# Patient Record
Sex: Female | Born: 1960 | Race: White | Hispanic: No | Smoking: Current every day smoker
Health system: Southern US, Community
[De-identification: ages and names within clinical notes are randomized; demographics above are authoritative.]

## PROBLEM LIST (undated history)

## (undated) DIAGNOSIS — K219 Gastro-esophageal reflux disease without esophagitis: Secondary | ICD-10-CM

## (undated) DIAGNOSIS — E039 Hypothyroidism, unspecified: Secondary | ICD-10-CM

## (undated) DIAGNOSIS — I10 Essential (primary) hypertension: Secondary | ICD-10-CM

## (undated) DIAGNOSIS — M1990 Unspecified osteoarthritis, unspecified site: Secondary | ICD-10-CM

## (undated) DIAGNOSIS — F419 Anxiety disorder, unspecified: Secondary | ICD-10-CM

## (undated) HISTORY — DX: Anxiety disorder, unspecified: F41.9

## (undated) HISTORY — PX: OTHER SURGICAL HISTORY: SHX169

---

## 2005-04-05 ENCOUNTER — Other Ambulatory Visit: Admission: RE | Admit: 2005-04-05 | Discharge: 2005-04-05 | Payer: Self-pay | Admitting: Unknown Physician Specialty

## 2005-04-05 ENCOUNTER — Encounter (INDEPENDENT_AMBULATORY_CARE_PROVIDER_SITE_OTHER): Payer: Self-pay | Admitting: *Deleted

## 2005-04-05 ENCOUNTER — Encounter (INDEPENDENT_AMBULATORY_CARE_PROVIDER_SITE_OTHER): Payer: Self-pay | Admitting: Specialist

## 2005-04-06 ENCOUNTER — Other Ambulatory Visit: Admission: RE | Admit: 2005-04-06 | Discharge: 2005-04-06 | Payer: Self-pay | Admitting: Unknown Physician Specialty

## 2007-03-11 ENCOUNTER — Emergency Department (HOSPITAL_COMMUNITY): Admission: EM | Admit: 2007-03-11 | Discharge: 2007-03-11 | Payer: Self-pay | Admitting: Emergency Medicine

## 2011-06-10 ENCOUNTER — Other Ambulatory Visit (HOSPITAL_COMMUNITY): Payer: Self-pay | Admitting: Nurse Practitioner

## 2011-06-10 DIAGNOSIS — Z139 Encounter for screening, unspecified: Secondary | ICD-10-CM

## 2011-06-16 ENCOUNTER — Ambulatory Visit (HOSPITAL_COMMUNITY)
Admission: RE | Admit: 2011-06-16 | Discharge: 2011-06-16 | Disposition: A | Discharge status: Home / Self Care | Payer: Self-pay | Source: Ambulatory Visit | Attending: Nurse Practitioner | Admitting: Nurse Practitioner

## 2011-06-16 DIAGNOSIS — Z139 Encounter for screening, unspecified: Secondary | ICD-10-CM

## 2012-12-21 ENCOUNTER — Other Ambulatory Visit (INDEPENDENT_AMBULATORY_CARE_PROVIDER_SITE_OTHER): Payer: Self-pay

## 2014-02-09 ENCOUNTER — Other Ambulatory Visit: Payer: Self-pay | Admitting: Oncology

## 2014-05-26 IMAGING — MG MM DIGITAL SCREENING
4 series · 4 of 4 positions shown · non-contrast
Comparison: Previous Exams

CLINICAL DATA: Screening.

MAMMOGRAPHIC BILATERAL DIGITAL SCREENING WITH CAD

[L CC]
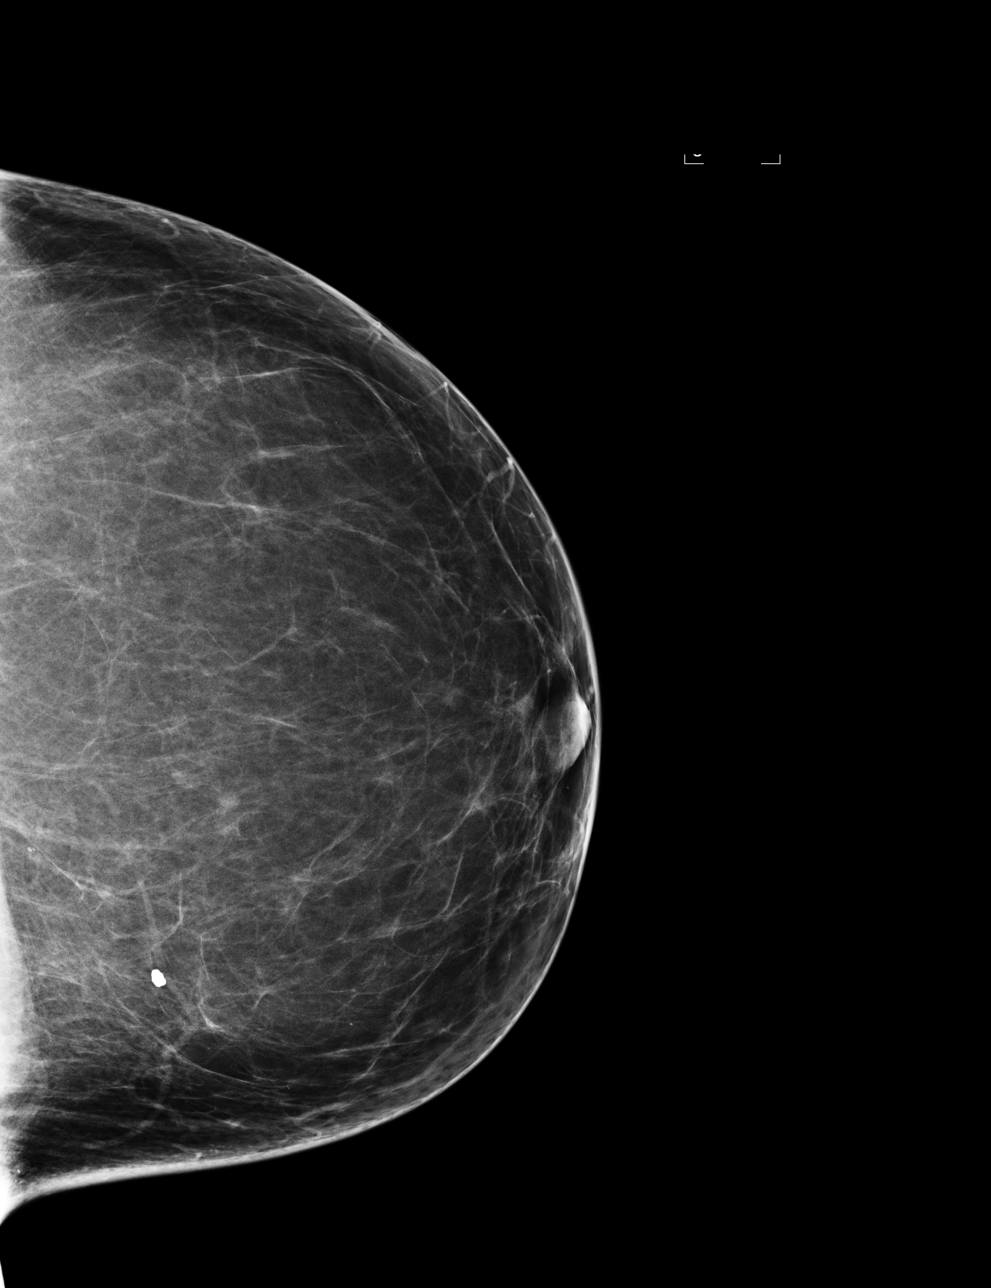

[L MLO]
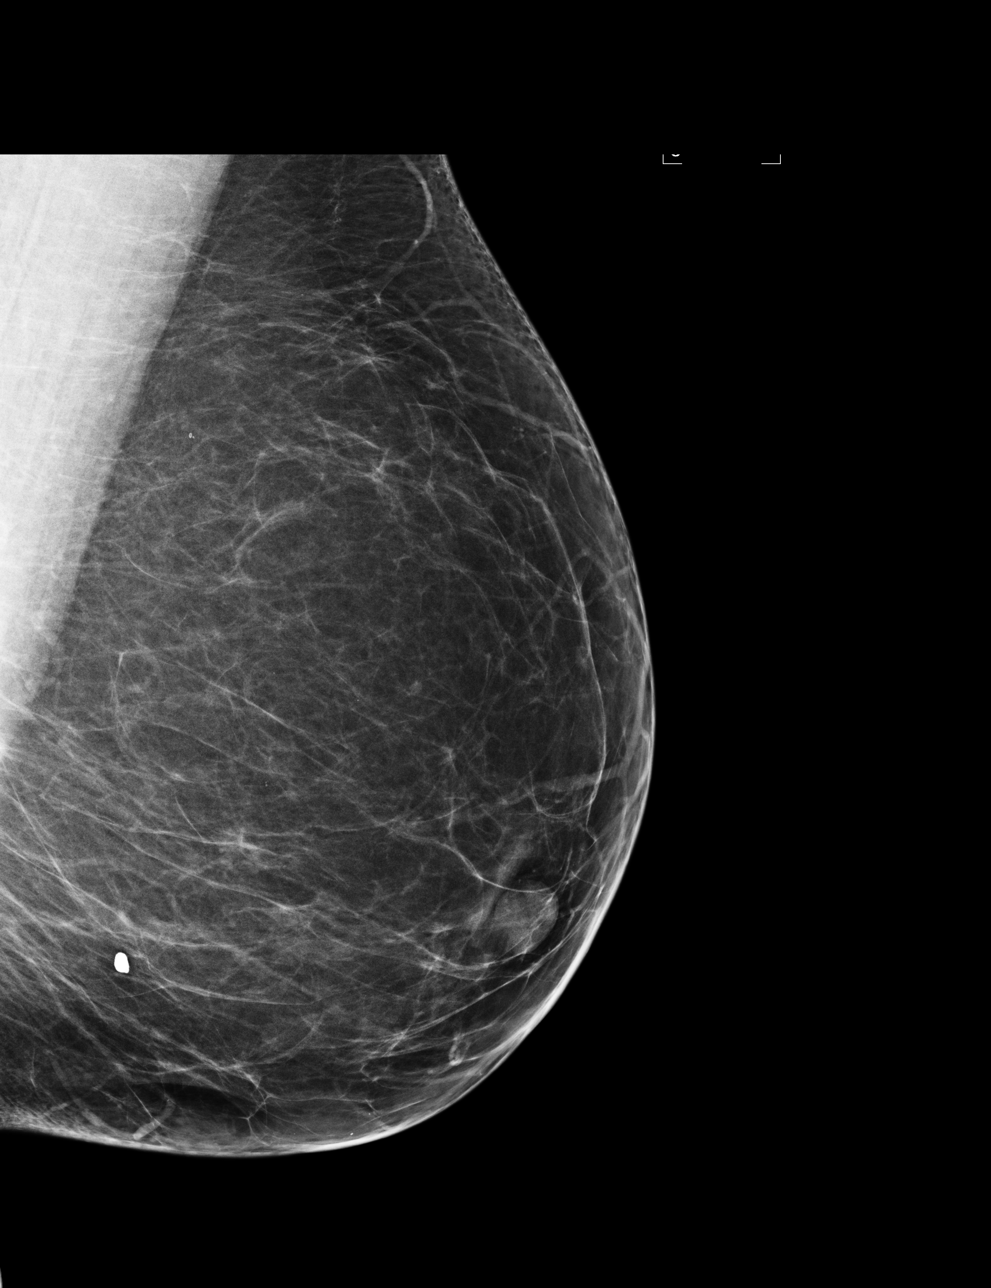

[R CC]
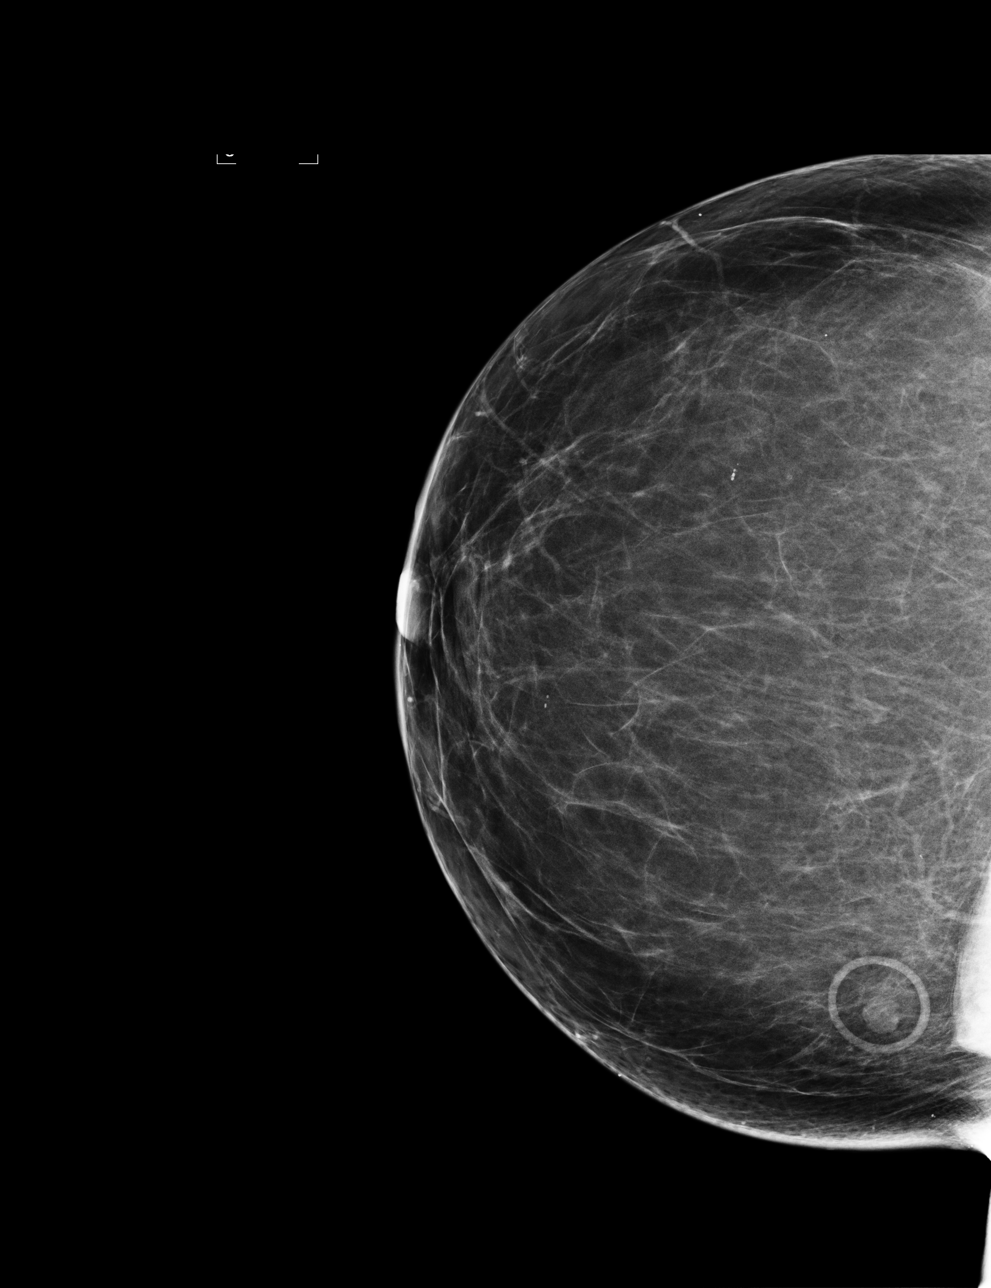

[R MLO]
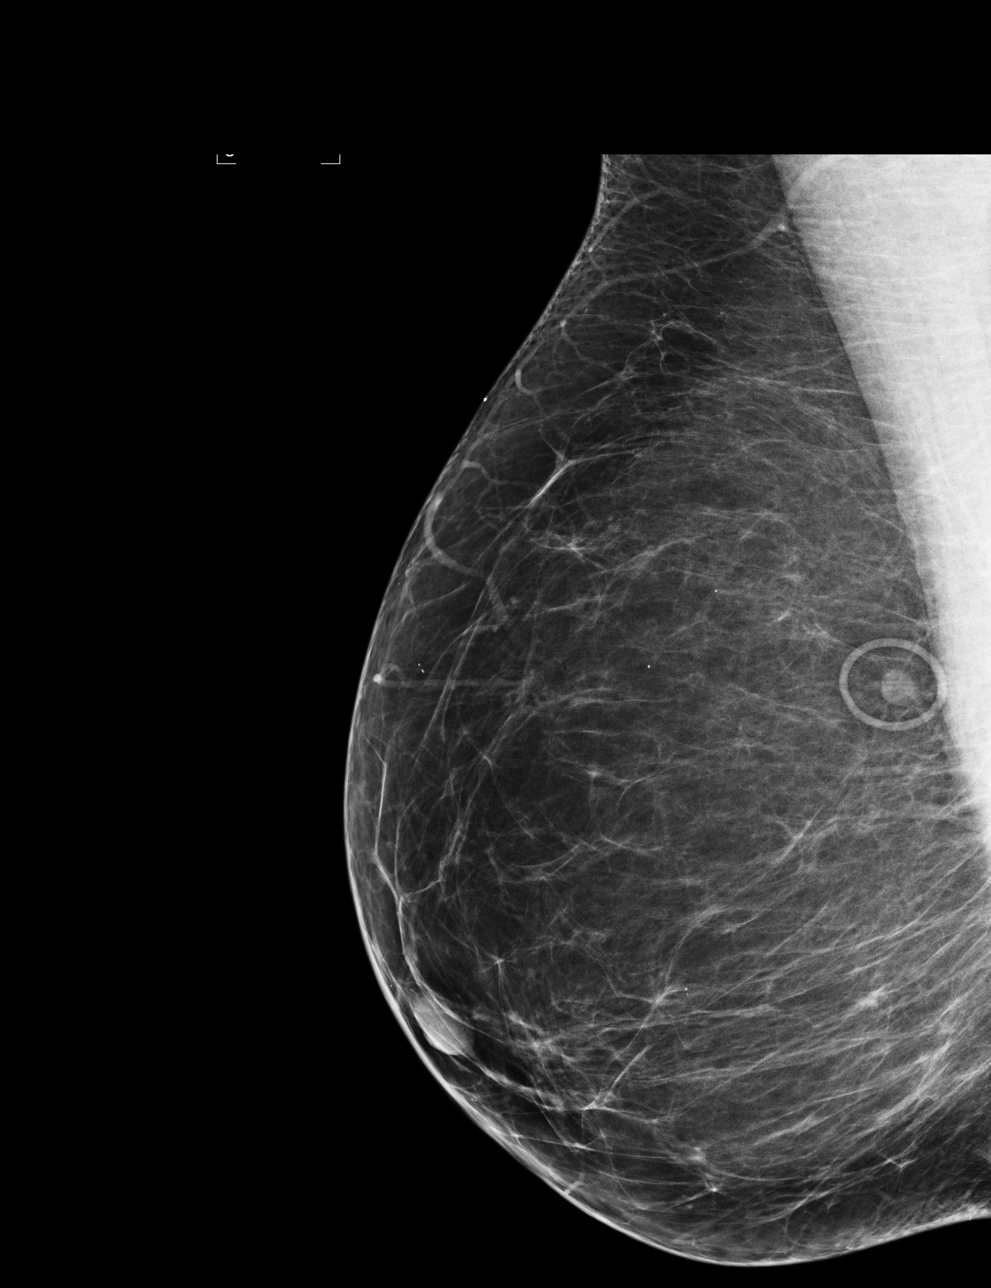

[4 of 4 positions shown; findings below may reference images not displayed]

FINDINGS: The breast tissue is almost entirely fatty. No suspicious
masses, architectural distortion, or calcifications are present.

Images were processed with CAD.
IMPRESSION: No specific mammographic evidence of malignancy.

A result letter of this screening mammogram will be mailed directly
to the patient.

RECOMMENDATION:
 Screening mammogram in one year. (Code:[id number])

BI-RADS CATEGORY 1:  Negative

## 2014-06-18 ENCOUNTER — Other Ambulatory Visit (HOSPITAL_COMMUNITY): Payer: Self-pay | Admitting: Internal Medicine

## 2014-06-18 DIAGNOSIS — Z1231 Encounter for screening mammogram for malignant neoplasm of breast: Secondary | ICD-10-CM

## 2014-06-25 ENCOUNTER — Ambulatory Visit (HOSPITAL_COMMUNITY)
Admission: RE | Admit: 2014-06-25 | Discharge: 2014-06-25 | Disposition: A | Discharge status: Home / Self Care | Payer: BLUE CROSS/BLUE SHIELD | Source: Ambulatory Visit | Attending: Internal Medicine | Admitting: Internal Medicine

## 2014-06-25 DIAGNOSIS — Z1231 Encounter for screening mammogram for malignant neoplasm of breast: Secondary | ICD-10-CM | POA: Diagnosis not present

## 2015-06-11 ENCOUNTER — Other Ambulatory Visit (HOSPITAL_COMMUNITY): Payer: Self-pay | Admitting: Internal Medicine

## 2015-06-11 DIAGNOSIS — Z1231 Encounter for screening mammogram for malignant neoplasm of breast: Secondary | ICD-10-CM

## 2015-07-01 ENCOUNTER — Ambulatory Visit (HOSPITAL_COMMUNITY): Payer: BLUE CROSS/BLUE SHIELD

## 2015-07-02 ENCOUNTER — Ambulatory Visit (HOSPITAL_COMMUNITY)
Admission: RE | Admit: 2015-07-02 | Discharge: 2015-07-02 | Disposition: A | Discharge status: Home / Self Care | Payer: BLUE CROSS/BLUE SHIELD | Source: Ambulatory Visit | Attending: Internal Medicine | Admitting: Internal Medicine

## 2015-07-02 DIAGNOSIS — Z1231 Encounter for screening mammogram for malignant neoplasm of breast: Secondary | ICD-10-CM | POA: Diagnosis present

## 2015-11-04 ENCOUNTER — Other Ambulatory Visit (HOSPITAL_COMMUNITY): Payer: Self-pay | Admitting: Internal Medicine

## 2015-11-04 DIAGNOSIS — Z13820 Encounter for screening for osteoporosis: Secondary | ICD-10-CM

## 2015-11-11 ENCOUNTER — Ambulatory Visit: Payer: BLUE CROSS/BLUE SHIELD | Admitting: Orthopaedic Surgery

## 2015-11-23 ENCOUNTER — Other Ambulatory Visit (HOSPITAL_COMMUNITY): Payer: BLUE CROSS/BLUE SHIELD

## 2015-11-25 ENCOUNTER — Other Ambulatory Visit (HOSPITAL_COMMUNITY): Payer: BLUE CROSS/BLUE SHIELD

## 2015-12-02 ENCOUNTER — Ambulatory Visit (INDEPENDENT_AMBULATORY_CARE_PROVIDER_SITE_OTHER): Payer: BLUE CROSS/BLUE SHIELD

## 2015-12-02 ENCOUNTER — Ambulatory Visit (INDEPENDENT_AMBULATORY_CARE_PROVIDER_SITE_OTHER): Payer: BLUE CROSS/BLUE SHIELD | Admitting: Orthopaedic Surgery

## 2015-12-02 ENCOUNTER — Ambulatory Visit (HOSPITAL_COMMUNITY)
Admission: RE | Admit: 2015-12-02 | Discharge: 2015-12-02 | Disposition: A | Discharge status: Home / Self Care | Payer: BLUE CROSS/BLUE SHIELD | Source: Ambulatory Visit | Attending: Internal Medicine | Admitting: Internal Medicine

## 2015-12-02 ENCOUNTER — Encounter: Payer: Self-pay | Admitting: Orthopaedic Surgery

## 2015-12-02 VITALS — BP 105/65 | HR 113 | Temp 97.3°F | Ht 64.0 in | Wt 172.0 lb

## 2015-12-02 DIAGNOSIS — Z13820 Encounter for screening for osteoporosis: Secondary | ICD-10-CM | POA: Diagnosis present

## 2015-12-02 DIAGNOSIS — M8589 Other specified disorders of bone density and structure, multiple sites: Secondary | ICD-10-CM | POA: Diagnosis not present

## 2015-12-02 DIAGNOSIS — M7062 Trochanteric bursitis, left hip: Secondary | ICD-10-CM | POA: Diagnosis not present

## 2015-12-02 DIAGNOSIS — F17210 Nicotine dependence, cigarettes, uncomplicated: Secondary | ICD-10-CM | POA: Diagnosis not present

## 2015-12-02 DIAGNOSIS — M25552 Pain in left hip: Secondary | ICD-10-CM | POA: Diagnosis not present

## 2015-12-02 MED ORDER — NAPROXEN 500 MG PO TABS: 500.0000 mg | ORAL_TABLET | Freq: Two times a day (BID) | ORAL | 5 refills | Status: DC

## 2015-12-02 NOTE — Patient Instructions (Signed)
Steps to Quit Smoking Smoking tobacco can be bad for your health. It can also affect almost every organ in your body. Smoking puts you and people around you at risk for many serious long-lasting (chronic) diseases. Quitting smoking is hard, but it is one of the best things that you can do for your health. It is never too late to quit. What are the benefits of quitting smoking? When you quit smoking, you lower your risk for getting serious diseases and conditions. They can include:  Lung cancer or lung disease.  Heart disease.  Stroke.  Heart attack.  Not being able to have children (infertility).  Weak bones (osteoporosis) and broken bones (fractures). If you have coughing, wheezing, and shortness of breath, those symptoms may get better when you quit. You may also get sick less often. If you are pregnant, quitting smoking can help to lower your chances of having a baby of low birth weight. What can I do to help me quit smoking? Talk with your doctor about what can help you quit smoking. Some things you can do (strategies) include:  Quitting smoking totally, instead of slowly cutting back how much you smoke over a period of time.  Going to in-person counseling. You are more likely to quit if you go to many counseling sessions.  Using resources and support systems, such as:  Online chats with a counselor.  Phone quitlines.  Printed self-help materials.  Support groups or group counseling.  Text messaging programs.  Mobile phone apps or applications.  Taking medicines. Some of these medicines may have nicotine in them. If you are pregnant or breastfeeding, do not take any medicines to quit smoking unless your doctor says it is okay. Talk with your doctor about counseling or other things that can help you. Talk with your doctor about using more than one strategy at the same time, such as taking medicines while you are also going to in-person counseling. This can help make quitting  easier. What things can I do to make it easier to quit? Quitting smoking might feel very hard at first, but there is a lot that you can do to make it easier. Take these steps:  Talk to your family and friends. Ask them to support and encourage you.  Call phone quitlines, reach out to support groups, or work with a counselor.  Ask people who smoke to not smoke around you.  Avoid places that make you want (trigger) to smoke, such as:  Bars.  Parties.  Smoke-break areas at work.  Spend time with people who do not smoke.  Lower the stress in your life. Stress can make you want to smoke. Try these things to help your stress:  Getting regular exercise.  Deep-breathing exercises.  Yoga.  Meditating.  Doing a body scan. To do this, close your eyes, focus on one area of your body at a time from head to toe, and notice which parts of your body are tense. Try to relax the muscles in those areas.  Download or buy apps on your mobile phone or tablet that can help you stick to your quit plan. There are many free apps, such as QuitGuide from the CDC (Centers for Disease Control and Prevention). You can find more support from smokefree.gov and other websites. This information is not intended to replace advice given to you by your health care provider. Make sure you discuss any questions you have with your health care provider. Document Released: 10/16/2008 Document Revised: 08/18/2015 Document   Reviewed: 05/06/2014 Elsevier Interactive Patient Education  2017 Elsevier Inc.  

## 2015-12-02 NOTE — Progress Notes (Signed)
Subjective:    Patient ID: Melissa Vega, female    DOB: 02/09/1960, 55 y.o.   MRN: [id number]  HPI She has had left hip pain for several months.  She has no trauma. She has lateral pain of the hip.  She has been limping. She has no redness. She saw Dr. Hughie ClossZ. Hall and he asked she come here.  She has not taken any medicine, not used ice or heat.  She has no other joint pain.   Review of Systems  HENT: Negative for congestion.   Respiratory: Negative for cough and shortness of breath.   Cardiovascular: Negative for chest pain and leg swelling.  Endocrine: Negative for cold intolerance.  Musculoskeletal: Positive for arthralgias and gait problem.  Allergic/Immunologic: Negative for environmental allergies.   History reviewed. No pertinent past medical history.  History reviewed. No pertinent surgical history.  No current outpatient prescriptions on file prior to visit.   No current facility-administered medications on file prior to visit.     Social History   Social History  . Marital status: Widowed    Spouse name: N/A  . Number of children: N/A  . Years of education: N/A   Occupational History  . Not on file.   Social History Main Topics  . Smoking status: Current Every Day Smoker  . Smokeless tobacco: Never Used  . Alcohol use Not on file  . Drug use: Unknown  . Sexual activity: Not on file   Other Topics Concern  . Not on file   Social History Narrative  . No narrative on file    Family History  Problem Relation Age of Onset  . Cancer Mother   . Cancer Father   . Cancer Brother     BP 105/65   Pulse (!) 113   Temp 97.3 F (36.3 C)   Ht 5\' 4"  (1.626 m)   Wt 172 lb (78 kg)   BMI 29.52 kg/m      Objective:   Physical Exam  Constitutional: She is oriented to person, place, and time. She appears well-developed and well-nourished.  HENT:  Head: Normocephalic and atraumatic.  Eyes: Conjunctivae and EOM are normal. Pupils are equal, round, and reactive  to light.  Neck: Normal range of motion. Neck supple.  Cardiovascular: Normal rate, regular rhythm and intact distal pulses.   Pulmonary/Chest: Effort normal.  Abdominal: Soft.  Musculoskeletal: She exhibits tenderness (Pain left hip over trochanteric area, no swelling, no redness, ROM full.  Right hip negative.  Limp to the left.).  Neurological: She is alert and oriented to person, place, and time. She displays normal reflexes. No cranial nerve deficit. She exhibits normal muscle tone. Coordination normal.  Skin: Skin is warm and dry.  Psychiatric: She has a normal mood and affect. Her behavior is normal. Judgment and thought content normal.    X-rays of the left hip were done, reported separately.  She smokes and is not willing to stop or cut back.    Assessment & Plan:   Encounter Diagnoses  Name Primary?  . Hip pain, acute, left Yes  . Trochanteric bursitis, left hip   . Cigarette nicotine dependence without complication    I have explained her diagnosis to her.  PROCEDURE NOTE:  The patient request injection, verbal consent was obtained.  The left trochanteric area of the hip was prepped appropriately after time out was performed.   Sterile technique was observed and injection of 1 cc of Depo-Medrol 40 mg with several  cc's of plain xylocaine. Anesthesia was provided by ethyl chloride and a 20-gauge needle was used to inject the hip area. The injection was tolerated well.  A band aid dressing was applied.  The patient was advised to apply ice later today and tomorrow to the injection sight as needed.  Return in two weeks.  Begin Naprosyn 500 po bid pc  Precautions discussed.  Call if any problem.  Electronically Signed Darreld McleanWayne Shalayna Ornstein, MD 11/29/20172:11 PM

## 2015-12-16 ENCOUNTER — Ambulatory Visit: Payer: BLUE CROSS/BLUE SHIELD | Admitting: Orthopaedic Surgery

## 2016-10-17 ENCOUNTER — Encounter: Payer: Self-pay | Admitting: Gastroenterology

## 2016-11-30 ENCOUNTER — Ambulatory Visit: Payer: BLUE CROSS/BLUE SHIELD | Admitting: Gastroenterology

## 2017-01-18 ENCOUNTER — Ambulatory Visit: Payer: BLUE CROSS/BLUE SHIELD | Admitting: Gastroenterology

## 2017-01-18 ENCOUNTER — Telehealth: Payer: Self-pay | Admitting: *Deleted

## 2017-01-18 ENCOUNTER — Encounter: Payer: Self-pay | Admitting: Gastroenterology

## 2017-01-18 ENCOUNTER — Encounter: Payer: Self-pay | Admitting: *Deleted

## 2017-01-18 ENCOUNTER — Telehealth: Payer: Self-pay

## 2017-01-18 ENCOUNTER — Other Ambulatory Visit: Payer: Self-pay | Admitting: *Deleted

## 2017-01-18 DIAGNOSIS — Z1211 Encounter for screening for malignant neoplasm of colon: Secondary | ICD-10-CM | POA: Diagnosis not present

## 2017-01-18 DIAGNOSIS — K219 Gastro-esophageal reflux disease without esophagitis: Secondary | ICD-10-CM | POA: Diagnosis not present

## 2017-01-18 NOTE — Telephone Encounter (Signed)
Called and informed pt of pre-op appt 02/01/17 at 1:45pm. Letter mailed.

## 2017-01-18 NOTE — Assessment & Plan Note (Signed)
SYMPTOMS FAIRLY WELL CONTROLLED.  CONTINUE OMEPRAZOLE.  TAKE 30 MINUTES PRIOR TO YOUR FIRST MEAL.  

## 2017-01-18 NOTE — H&P (View-Only) (Signed)
 Subjective:    Patient ID: Melissa Vega, female    DOB: 11/17/1960, 56 y.o.   MRN: [id number]  Hall, John Z, MD  HPI Once in awhile she sees blood. NO BLOOD IN PAST 2-3 MOS. WAS ON DICLOFENAC MAY 2018 WHEN [facility] THOUGHT SHE WAS HAVING A HEART ATTACK. NEVER HAD SURGERY. EPISODE OF CHEST PAIN AT WORK. WENT TO ED. NO CARDIOLOGY EVALUATION. SMOKES 1/2-1 WHOLE PK CIGS A DAY. BMs:2-3 TIMES (LOOSE FOR YEARS-DIARRHEA). NO RECTAL PRESSURE, PAIN, ITCHING, BURNING, OR SOILING. MILK: NOT A WHOLE LOT, CHEESE: NOT MUCH, ICE CREAM: NOT MUCH, FAST FOOD: 2X/WEEK. SYNTHROID DOSE CHANGED IN OCT 2018. HAVING A SUPER BOWL PARTY.    PT DENIES FEVER, CHILLS, HEMATEMESIS, nausea, vomiting, melena, diarrhea, SHORTNESS OF BREATH, CHANGE IN BOWEL IN HABITS, constipation, abdominal pain, problems swallowing, OR heartburn or indigestion.  Past Medical History:  Diagnosis Date  . Anxiety    Past Surgical History:  Procedure Laterality Date  . NONE     Allergies  Allergen Reactions  . Penicillins    Current Outpatient Medications  Medication Sig Dispense Refill  . B Complex-C-Folic Acid (STRESS B COMPLEX PO) Take by mouth daily.    . diazepam (VALIUM) 5 MG tablet 2 (two) times daily as needed.    . levothyroxine (SYNTHROID, LEVOTHROID) 100 MCG tablet daily.    . lisinopril-hydrochlorothiazide (PRINZIDE,ZESTORETIC) 20-12.5 MG tablet daily.    . LYSINE PO Take by mouth daily.    . Misc Natural Products (OSTEO BI-FLEX JOINT SHIELD PO) Take by mouth daily.    . Multiple Vitamins-Minerals (CENTRUM SILVER PO) Take by mouth daily.    . naproxen (NAPROSYN) 500 MG tablet Take 500 mg by mouth daily.    . Omega-3 Fatty Acids (FISH OIL) 1000 MG CAPS Take by mouth daily.    . omeprazole (PRILOSEC) 40 MG capsule daily.    . Potassium 99 MG TABS Take by mouth daily.    . pregabalin (LYRICA) 75 MG capsule Take 75 mg by mouth daily.    BC POWDER EVERY DAY   Social History   Socioeconomic History  . Marital status:  Widowed    Spouse name: None  . Number of children: None  . Years of education: None  . Highest education level: None  Social Needs  . Financial resource strain: None  . Food insecurity - worry: None  . Food insecurity - inability: None  . Transportation needs - medical: None  . Transportation needs - non-medical: None  Occupational History  . None  Tobacco Use  . Smoking status: Current Every Day Smoker  . Smokeless tobacco: Never Used  Substance and Sexual Activity  . Alcohol use: None  . Drug use: None  . Sexual activity: None  Other Topics Concern  . None  Social History Narrative   WIDOWED FOR '96. HUSBAND DIED WITH LUNG CANCER. BOYFRIEND DIED OF RECTAL CANCER.    KIDS: 1 KID-FEMALE AGE 37. 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   Review of Systems PER HPI OTHERWISE ALL SYSTEMS ARE NEGATIVE.    Objective:   Physical Exam  Constitutional: She is oriented to person, place, and time. She appears well-developed and well-nourished. No distress.  HENT:  Head: Normocephalic and atraumatic.  Mouth/Throat: Oropharynx is clear and moist. No oropharyngeal exudate.  Eyes: Pupils are equal, round, and reactive to light. No scleral icterus.  Neck: Normal range of motion. Neck supple.  Cardiovascular: Normal rate, regular rhythm and   normal heart sounds.  Pulmonary/Chest: Effort normal and breath sounds normal. No respiratory distress.  Abdominal: Soft. Bowel sounds are normal. She exhibits no distension. There is no tenderness.  Musculoskeletal: She exhibits no edema.  Lymphadenopathy:    She has no cervical adenopathy.  Neurological: She is alert and oriented to person, place, and time.  NO FOCAL DEFICITS  Psychiatric: She has a normal mood and affect.  Vitals reviewed.     Assessment & Plan:

## 2017-01-18 NOTE — Assessment & Plan Note (Addendum)
AVERAGE RISK. CURRENTLY NO WARNING SIGNS/SYMPTOMS  DRINK WATER TO KEEP YOUR URINE LIGHT YELLOW. FOLLOW A HIGH FIBER DIET.  HANDOUT GIVEN. Complete colonoscopy FEB 5. DISCUSSED PROCEDURE, COLOWRAP, BENEFITS, & RISKS: < 1% chance of medication reaction, bleeding, perforation, or rupture of spleen/liver. USE PREPARATION H FOUR TIMES  A DAY IF NEEDED TO RELIEVE RECTAL PAIN/PRESSURE/BLEEDING.  FOLLOW UP IF NEEDED.

## 2017-01-18 NOTE — Patient Instructions (Signed)
DRINK WATER TO KEEP YOUR URINE LIGHT YELLOW.  FOLLOW A HIGH FIBER DIET. SEE INFO BELOW.  Complete colonoscopy FEB 5.  USE PREPARATION H FOUR TIMES  A DAY IF NEEDED TO RELIEVE RECTAL PAIN/PRESSURE/BLEEDING.  FOLLOW UP IF NEEDED.  High-Fiber Diet A high-fiber diet changes your normal diet to include more whole grains, legumes, fruits, and vegetables. Changes in the diet involve replacing refined carbohydrates with unrefined foods. The calorie level of the diet is essentially unchanged. The Dietary Reference Intake (recommended amount) for adult males is 38 grams per day. For adult females, it is 25 grams per day. Pregnant and lactating women should consume 28 grams of fiber per day.Fiber is the intact part of a plant that is not broken down during digestion. Functional fiber is fiber that has been isolated from the plant to provide a beneficial effect in the body.  PURPOSE  Increase stool bulk.   Ease and regulate bowel movements.   Lower cholesterol.   REDUCE RISK OF COLON CANCER  INDICATIONS THAT YOU NEED MORE FIBER  Constipation and hemorrhoids.   Uncomplicated diverticulosis (intestine condition) and irritable bowel syndrome.   Weight management.   As a protective measure against hardening of the arteries (atherosclerosis), diabetes, and cancer.   GUIDELINES FOR INCREASING FIBER IN THE DIET  Start adding fiber to the diet slowly. A gradual increase of about 5 more grams (2 slices of whole-wheat bread, 2 servings of most fruits or vegetables, or 1 bowl of high-fiber cereal) per day is best. Too rapid an increase in fiber may result in constipation, flatulence, and bloating.   Drink enough water and fluids to keep your urine clear or pale yellow. Water, juice, or caffeine-free drinks are recommended. Not drinking enough fluid may cause constipation.   Eat a variety of high-fiber foods rather than one type of fiber.   Try to increase your intake of fiber through using  high-fiber foods rather than fiber pills or supplements that contain small amounts of fiber.   The goal is to change the types of food eaten. Do not supplement your present diet with high-fiber foods, but replace foods in your present diet.  INCLUDE A VARIETY OF FIBER SOURCES  Replace refined and processed grains with whole grains, canned fruits with fresh fruits, and incorporate other fiber sources. White rice, white breads, and most bakery goods contain little or no fiber.   Brown whole-grain rice, buckwheat oats, and many fruits and vegetables are all good sources of fiber. These include: broccoli, Brussels sprouts, cabbage, cauliflower, beets, sweet potatoes, white potatoes (skin on), carrots, tomatoes, eggplant, squash, berries, fresh fruits, and dried fruits.   Cereals appear to be the richest source of fiber. Cereal fiber is found in whole grains and bran. Bran is the fiber-rich outer coat of cereal grain, which is largely removed in refining. In whole-grain cereals, the bran remains. In breakfast cereals, the largest amount of fiber is found in those with "bran" in their names. The fiber content is sometimes indicated on the label.   You may need to include additional fruits and vegetables each day.   In baking, for 1 cup white flour, you may use the following substitutions:   1 cup whole-wheat flour minus 2 tablespoons.   1/2 cup white flour plus 1/2 cup whole-wheat flour.

## 2017-01-18 NOTE — Telephone Encounter (Signed)
CALLED PT INSURANCE WITH [employer] AND WAS ADVISED NO PRECERT REQUIRED FOR TCS

## 2017-01-18 NOTE — Progress Notes (Addendum)
Subjective:    Patient ID: Melissa Vega, female    DOB: 01/11/1960, 57 y.o.   MRN: [id number]  Benita StabileHall, John Z, MD  HPI Once in awhile she sees blood. NO BLOOD IN PAST 2-3 MOS. WAS ON DICLOFENAC MAY 2018 WHEN DOCS THOUGHT SHE WAS HAVING A HEART ATTACK. NEVER HAD SURGERY. EPISODE OF CHEST PAIN AT WORK. WENT TO ED. NO CARDIOLOGY EVALUATION. SMOKES 1/2-1 WHOLE PK CIGS A DAY. BMs:2-3 TIMES (LOOSE FOR YEARS-DIARRHEA). NO RECTAL PRESSURE, PAIN, ITCHING, BURNING, OR SOILING. MILK: NOT A WHOLE LOT, CHEESE: NOT MUCH, ICE CREAM: NOT MUCH, FAST FOOD: 2X/WEEK. SYNTHROID DOSE CHANGED IN OCT 2018. HAVING A SUPER BOWL PARTY.    PT DENIES FEVER, CHILLS, HEMATEMESIS, nausea, vomiting, melena, diarrhea, SHORTNESS OF BREATH, CHANGE IN BOWEL IN HABITS, constipation, abdominal pain, problems swallowing, OR heartburn or indigestion.  Past Medical History:  Diagnosis Date  . Anxiety    Past Surgical History:  Procedure Laterality Date  . NONE     Allergies  Allergen Reactions  . Penicillins    Current Outpatient Medications  Medication Sig Dispense Refill  . B Complex-C-Folic Acid (STRESS B COMPLEX PO) Take by mouth daily.    . diazepam (VALIUM) 5 MG tablet 2 (two) times daily as needed.    Marland Kitchen. levothyroxine (SYNTHROID, LEVOTHROID) 100 MCG tablet daily.    Marland Kitchen. lisinopril-hydrochlorothiazide (PRINZIDE,ZESTORETIC) 20-12.5 MG tablet daily.    Marland Kitchen. LYSINE PO Take by mouth daily.    . Misc Natural Products (OSTEO BI-FLEX JOINT SHIELD PO) Take by mouth daily.    . Multiple Vitamins-Minerals (CENTRUM SILVER PO) Take by mouth daily.    . naproxen (NAPROSYN) 500 MG tablet Take 500 mg by mouth daily.    . Omega-3 Fatty Acids (FISH OIL) 1000 MG CAPS Take by mouth daily.    Marland Kitchen. omeprazole (PRILOSEC) 40 MG capsule daily.    . Potassium 99 MG TABS Take by mouth daily.    . pregabalin (LYRICA) 75 MG capsule Take 75 mg by mouth daily.    BC POWDER EVERY DAY   Social History   Socioeconomic History  . Marital status:  Widowed    Spouse name: None  . Number of children: None  . Years of education: None  . Highest education level: None  Social Needs  . Financial resource strain: None  . Food insecurity - worry: None  . Food insecurity - inability: None  . Transportation needs - medical: None  . Transportation needs - non-medical: None  Occupational History  . None  Tobacco Use  . Smoking status: Current Every Day Smoker  . Smokeless tobacco: Never Used  Substance and Sexual Activity  . Alcohol use: None  . Drug use: None  . Sexual activity: None  Other Topics Concern  . None  Social History Narrative   WIDOWED FOR '96. HUSBAND DIED WITH LUNG CANCER. BOYFRIEND DIED OF RECTAL CANCER.    KIDS: 1 KID-FEMALE AGE 30. 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   Review of Systems PER HPI OTHERWISE ALL SYSTEMS ARE NEGATIVE.    Objective:   Physical Exam  Constitutional: She is oriented to person, place, and time. She appears well-developed and well-nourished. No distress.  HENT:  Head: Normocephalic and atraumatic.  Mouth/Throat: Oropharynx is clear and moist. No oropharyngeal exudate.  Eyes: Pupils are equal, round, and reactive to light. No scleral icterus.  Neck: Normal range of motion. Neck supple.  Cardiovascular: Normal rate, regular rhythm and  normal heart sounds.  Pulmonary/Chest: Effort normal and breath sounds normal. No respiratory distress.  Abdominal: Soft. Bowel sounds are normal. She exhibits no distension. There is no tenderness.  Musculoskeletal: She exhibits no edema.  Lymphadenopathy:    She has no cervical adenopathy.  Neurological: She is alert and oriented to person, place, and time.  NO FOCAL DEFICITS  Psychiatric: She has a normal mood and affect.  Vitals reviewed.     Assessment & Plan:

## 2017-01-19 NOTE — Progress Notes (Signed)
CC'D TO PCP °

## 2017-01-30 NOTE — Patient Instructions (Signed)
Melissa Vega  01/30/2017     @PREFPERIOPPHARMACY @   Your procedure is scheduled on  2/50/2019   Report to [facility] at  1215   P.M.  Call this number if you have problems the morning of surgery:  [phone]   Remember:  Do not eat food or drink liquids after midnight.  Take these medicines the morning of surgery with A SIP OF WATER  Valium, levothyroxine, prilosec.   Do not wear jewelry, make-up or nail polish.  Do not wear lotions, powders, or perfumes, or deodorant.  Do not shave 48 hours prior to surgery.  Men may shave face and neck.  Do not bring valuables to the hospital.  [facility] not [facility] any belongings or valuables.  Contacts, dentures or bridgework may not be worn into surgery.  Leave your suitcase in the car.  After surgery it may be brought to your room.  For patients admitted to the hospital, discharge time will be determined by your treatment team.  Patients discharged the day of surgery will not be allowed to drive home.   Name and phone number of your driver:   family Special instructions:  Follow the diet and prep instructions given to you by Dr Evelina DunField's office.  Please read over the following fact sheets that you were given. Anesthesia Post-op Instructions and Care and Recovery After Surgery       Colonoscopy, Adult A colonoscopy is an exam to look at the large intestine. It is done to check for problems, such as:  Lumps (tumors).  Growths (polyps).  Swelling (inflammation).  Bleeding.  What happens before the procedure? Eating and drinking Follow instructions from your doctor about eating and drinking. These instructions may include:  A few days before the procedure - follow a low-fiber diet. ? Avoid nuts. ? Avoid seeds. ? Avoid dried fruit. ? Avoid raw fruits. ? Avoid vegetables.  1-3 days before the procedure - follow a clear liquid diet. Avoid liquids that have red or purple dye. Drink only clear  liquids, such as: ? Clear broth or bouillon. ? Black coffee or tea. ? Clear juice. ? Clear soft drinks or sports drinks. ? Gelatin dessert. ? Popsicles.  On the day of the procedure - do not eat or drink anything during the 2 hours before the procedure.  Bowel prep If you were prescribed an oral bowel prep:  Take it as told by your doctor. Starting the day before your procedure, you will need to drink a lot of liquid. The liquid will cause you to poop (have bowel movements) until your poop is almost clear or light green.  If your skin or butt gets irritated from diarrhea, you may: ? Wipe the area with wipes that have medicine in them, such as adult wet wipes with aloe and vitamin E. ? Put something on your skin that soothes the area, such as petroleum jelly.  If you throw up (vomit) while drinking the bowel prep, take a break for up to 60 minutes. Then begin the bowel prep again. If you keep throwing up and you cannot take the bowel prep without throwing up, call your doctor.  General instructions  Ask your doctor about changing or stopping your normal medicines. This is important if you take diabetes medicines or blood thinners.  Plan to have someone take you home from the hospital or clinic. What happens during the procedure?  An IV tube  may be put into one of your veins.  You will be given medicine to help you relax (sedative).  To reduce your risk of infection: ? Your doctors will wash their hands. ? Your anal area will be washed with soap.  You will be asked to lie on your side with your knees bent.  Your doctor will get a long, thin, flexible tube ready. The tube will have a camera and a light on the end.  The tube will be put into your anus.  The tube will be gently put into your large intestine.  Air will be delivered into your large intestine to keep it open. You may feel some pressure or cramping.  The camera will be used to take photos.  A small tissue  sample may be removed from your body to be looked at under a microscope (biopsy). If any possible problems are found, the tissue will be sent to a lab for testing.  If small growths are found, your doctor may remove them and have them checked for cancer.  The tube that was put into your anus will be slowly removed. The procedure may vary among doctors and hospitals. What happens after the procedure?  Your doctor will check on you often until the medicines you were given have worn off.  Do not drive for 24 hours after the procedure.  You may have a small amount of blood in your poop.  You may pass gas.  You may have mild cramps or bloating in your belly (abdomen).  It is up to you to get the results of your procedure. Ask your doctor, or the department performing the procedure, when your results will be ready. This information is not intended to replace advice given to you by your health care provider. Make sure you discuss any questions you have with your health care provider. Document Released: 01/22/2010 Document Revised: 10/21/2015 Document Reviewed: 03/03/2015 Elsevier Interactive Patient Education  2017 [employer]  Colonoscopy, Adult, Care After This sheet gives you information about how to care for yourself after your procedure. Your health care provider may also give you more specific instructions. If you have problems or questions, contact your health care provider. What can I expect after the procedure? After the procedure, it is common to have:  A small amount of blood in your stool for 24 hours after the procedure.  Some gas.  Mild abdominal cramping or bloating.  Follow these instructions at home: General instructions   For the first 24 hours after the procedure: ? Do not drive or use machinery. ? Do not sign important documents. ? Do not drink alcohol. ? Do your regular daily activities at a slower pace than normal. ? Eat soft, easy-to-digest foods. ? Rest  often.  Take over-the-counter or prescription medicines only as told by your health care provider.  It is up to you to get the results of your procedure. Ask your health care provider, or the department performing the procedure, when your results will be ready. Relieving cramping and bloating  Try walking around when you have cramps or feel bloated.  Apply heat to your abdomen as told by your health care provider. Use a heat source that your health care provider recommends, such as a moist heat pack or a heating pad. ? Place a towel between your skin and the heat source. ? Leave the heat on for 20-30 minutes. ? Remove the heat if your skin turns bright red. This is especially important if  you are unable to feel pain, heat, or cold. You may have a greater risk of getting burned. Eating and drinking  Drink enough fluid to keep your urine clear or pale yellow.  Resume your normal diet as instructed by your health care provider. Avoid heavy or fried foods that are hard to digest.  Avoid drinking alcohol for as long as instructed by your health care provider. Contact a health care provider if:  You have blood in your stool 2-3 days after the procedure. Get help right away if:  You have more than a small spotting of blood in your stool.  You pass large blood clots in your stool.  Your abdomen is swollen.  You have nausea or vomiting.  You have a fever.  You have increasing abdominal pain that is not relieved with medicine. This information is not intended to replace advice given to you by your health care provider. Make sure you discuss any questions you have with your health care provider. Document Released: 08/04/2003 Document Revised: 09/14/2015 Document Reviewed: 03/03/2015 Elsevier Interactive Patient Education  2018 [place] Anesthesia is a term that refers to techniques, procedures, and medicines that help a person stay safe and comfortable  during a medical procedure. Monitored anesthesia care, or sedation, is one type of anesthesia. Your anesthesia specialist may recommend sedation if you will be having a procedure that does not require you to be unconscious, such as:  Cataract surgery.  A dental procedure.  A biopsy.  A colonoscopy.  During the procedure, you may receive a medicine to help you relax (sedative). There are three levels of sedation:  Mild sedation. At this level, you may feel awake and relaxed. You will be able to follow directions.  Moderate sedation. At this level, you will be sleepy. You may not remember the procedure.  Deep sedation. At this level, you will be asleep. You will not remember the procedure.  The more medicine you are given, the deeper your level of sedation will be. Depending on how you respond to the procedure, the anesthesia specialist may change your level of sedation or the type of anesthesia to fit your needs. An anesthesia specialist will monitor you closely during the procedure. Let your health care provider know about:  Any allergies you have.  All medicines you are taking, including vitamins, herbs, eye drops, creams, and over-the-counter medicines.  Any use of steroids (by mouth or as a cream).  Any problems you or family members have had with sedatives and anesthetic medicines.  Any blood disorders you have.  Any surgeries you have had.  Any medical conditions you have, such as sleep apnea.  Whether you are pregnant or may be pregnant.  Any use of cigarettes, alcohol, or street drugs. What are the risks? Generally, this is a safe procedure. However, problems may occur, including:  Getting too much medicine (oversedation).  Nausea.  Allergic reaction to medicines.  Trouble breathing. If this happens, a breathing tube may be used to help with breathing. It will be removed when you are awake and breathing on your own.  Heart trouble.  Lung trouble.  Before  the procedure Staying hydrated Follow instructions from your health care provider about hydration, which may include:  Up to 2 hours before the procedure - you may continue to drink clear liquids, such as water, clear fruit juice, black coffee, and plain tea.  Eating and drinking restrictions Follow instructions from your health care provider about eating and  drinking, which may include:  8 hours before the procedure - stop eating heavy meals or foods such as meat, fried foods, or fatty foods.  6 hours before the procedure - stop eating light meals or foods, such as toast or cereal.  6 hours before the procedure - stop drinking milk or drinks that contain milk.  2 hours before the procedure - stop drinking clear liquids.  Medicines Ask your health care provider about:  Changing or stopping your regular medicines. This is especially important if you are taking diabetes medicines or blood thinners.  Taking medicines such as aspirin and ibuprofen. These medicines can thin your blood. Do not take these medicines before your procedure if your health care provider instructs you not to.  Tests and exams  You will have a physical exam.  You may have blood tests done to show: ? How well your kidneys and liver are working. ? How well your blood can clot.  General instructions  Plan to have someone take you home from the hospital or clinic.  If you will be going home right after the procedure, plan to have someone with you for 24 hours.  What happens during the procedure?  Your blood pressure, heart rate, breathing, level of pain and overall condition will be monitored.  An IV tube will be inserted into one of your veins.  Your anesthesia specialist will give you medicines as needed to keep you comfortable during the procedure. This may mean changing the level of sedation.  The procedure will be performed. After the procedure  Your blood pressure, heart rate, breathing rate, and  blood oxygen level will be monitored until the medicines you were given have worn off.  Do not drive for 24 hours if you received a sedative.  You may: ? Feel sleepy, clumsy, or nauseous. ? Feel forgetful about what happened after the procedure. ? Have a sore throat if you had a breathing tube during the procedure. ? Vomit. This information is not intended to replace advice given to you by your health care provider. Make sure you discuss any questions you have with your health care provider. Document Released: 09/15/2004 Document Revised: 05/29/2015 Document Reviewed: 04/12/2015 Elsevier Interactive Patient Education  2018 [place], Care After These instructions provide you with information about caring for yourself after your procedure. Your health care provider may also give you more specific instructions. Your treatment has been planned according to current medical practices, but problems sometimes occur. Call your health care provider if you have any problems or questions after your procedure. What can I expect after the procedure? After your procedure, it is common to:  Feel sleepy for several hours.  Feel clumsy and have poor balance for several hours.  Feel forgetful about what happened after the procedure.  Have poor judgment for several hours.  Feel nauseous or vomit.  Have a sore throat if you had a breathing tube during the procedure.  Follow these instructions at home: For at least 24 hours after the procedure:   Do not: ? Participate in activities in which you could fall or become injured. ? Drive. ? Use heavy machinery. ? Drink alcohol. ? Take sleeping pills or medicines that cause drowsiness. ? Make important decisions or sign legal documents. ? Take care of children on your own.  Rest. Eating and drinking  Follow the diet that is recommended by your health care provider.  If you vomit, drink water, juice, or soup when you  can drink without vomiting.  Make sure you have little or no nausea before eating solid foods. General instructions  Have a responsible adult stay with you until you are awake and alert.  Take over-the-counter and prescription medicines only as told by your health care provider.  If you smoke, do not smoke without supervision.  Keep all follow-up visits as told by your health care provider. This is important. Contact a health care provider if:  You keep feeling nauseous or you keep vomiting.  You feel light-headed.  You develop a rash.  You have a fever. Get help right away if:  You have trouble breathing. This information is not intended to replace advice given to you by your health care provider. Make sure you discuss any questions you have with your health care provider. Document Released: 04/12/2015 Document Revised: 08/12/2015 Document Reviewed: 04/12/2015 Elsevier Interactive Patient Education  [employer].

## 2017-02-01 ENCOUNTER — Encounter (HOSPITAL_COMMUNITY): Payer: Self-pay

## 2017-02-01 ENCOUNTER — Other Ambulatory Visit: Payer: Self-pay

## 2017-02-01 ENCOUNTER — Encounter (HOSPITAL_COMMUNITY)
Admission: RE | Admit: 2017-02-01 | Discharge: 2017-02-01 | Disposition: A | Discharge status: Home / Self Care | Payer: BLUE CROSS/BLUE SHIELD | Source: Ambulatory Visit | Attending: Gastroenterology | Admitting: Gastroenterology

## 2017-02-01 DIAGNOSIS — R197 Diarrhea, unspecified: Secondary | ICD-10-CM | POA: Diagnosis not present

## 2017-02-01 DIAGNOSIS — Z880 Allergy status to penicillin: Secondary | ICD-10-CM | POA: Diagnosis not present

## 2017-02-01 DIAGNOSIS — F419 Anxiety disorder, unspecified: Secondary | ICD-10-CM | POA: Diagnosis not present

## 2017-02-01 DIAGNOSIS — F17200 Nicotine dependence, unspecified, uncomplicated: Secondary | ICD-10-CM | POA: Diagnosis not present

## 2017-02-01 DIAGNOSIS — Z01812 Encounter for preprocedural laboratory examination: Secondary | ICD-10-CM | POA: Insufficient documentation

## 2017-02-01 DIAGNOSIS — Z79899 Other long term (current) drug therapy: Secondary | ICD-10-CM | POA: Insufficient documentation

## 2017-02-01 DIAGNOSIS — Z791 Long term (current) use of non-steroidal anti-inflammatories (NSAID): Secondary | ICD-10-CM | POA: Diagnosis not present

## 2017-02-01 HISTORY — DX: Hypothyroidism, unspecified: E03.9

## 2017-02-01 HISTORY — DX: Gastro-esophageal reflux disease without esophagitis: K21.9

## 2017-02-01 HISTORY — DX: Unspecified osteoarthritis, unspecified site: M19.90

## 2017-02-01 HISTORY — DX: Essential (primary) hypertension: I10

## 2017-02-01 LAB — CBC WITH DIFFERENTIAL/PLATELET
Basophils Absolute: 0 10*3/uL (ref 0.0–0.1)
Basophils Relative: 0 %
EOS PCT: 2 %
Eosinophils Absolute: 0.2 10*3/uL (ref 0.0–0.7)
HEMATOCRIT: 39.6 % (ref 36.0–46.0)
HEMOGLOBIN: 12.6 g/dL (ref 12.0–15.0)
LYMPHS ABS: 2.4 10*3/uL (ref 0.7–4.0)
LYMPHS PCT: 25 %
MCH: 32 pg (ref 26.0–34.0)
MCHC: 31.8 g/dL (ref 30.0–36.0)
MCV: 100.5 fL — AB (ref 78.0–100.0)
Monocytes Absolute: 0.4 10*3/uL (ref 0.1–1.0)
Monocytes Relative: 4 %
NEUTROS ABS: 6.7 10*3/uL (ref 1.7–7.7)
NEUTROS PCT: 69 %
Platelets: 319 10*3/uL (ref 150–400)
RBC: 3.94 MIL/uL (ref 3.87–5.11)
RDW: 13 % (ref 11.5–15.5)
WBC: 9.6 10*3/uL (ref 4.0–10.5)

## 2017-02-01 LAB — BASIC METABOLIC PANEL
Anion gap: 12 (ref 5–15)
BUN: 15 mg/dL (ref 6–20)
CO2: 22 mmol/L (ref 22–32)
Calcium: 9.5 mg/dL (ref 8.9–10.3)
Chloride: 103 mmol/L (ref 101–111)
Creatinine, Ser: 0.52 mg/dL (ref 0.44–1.00)
GFR calc Af Amer: >60 mL/min (ref >60)
GFR calc non Af Amer: >60 mL/min (ref >60)
GLUCOSE: 91 mg/dL (ref 65–99)
POTASSIUM: 3.6 mmol/L (ref 3.5–5.1)
Sodium: 137 mmol/L (ref 135–145)

## 2017-02-07 ENCOUNTER — Encounter (HOSPITAL_COMMUNITY): Payer: Self-pay | Admitting: *Deleted

## 2017-02-07 ENCOUNTER — Ambulatory Visit (HOSPITAL_COMMUNITY): Payer: BLUE CROSS/BLUE SHIELD | Admitting: Anesthesiology

## 2017-02-07 ENCOUNTER — Encounter (HOSPITAL_COMMUNITY)
Admission: RE | Disposition: A | Discharge status: Home / Self Care | Payer: Self-pay | Source: Ambulatory Visit | Attending: Gastroenterology

## 2017-02-07 ENCOUNTER — Ambulatory Visit (HOSPITAL_COMMUNITY)
Admission: RE | Admit: 2017-02-07 | Discharge: 2017-02-07 | Disposition: A | Discharge status: Home / Self Care | Payer: BLUE CROSS/BLUE SHIELD | Source: Ambulatory Visit | Attending: Gastroenterology | Admitting: Gastroenterology

## 2017-02-07 DIAGNOSIS — I10 Essential (primary) hypertension: Secondary | ICD-10-CM | POA: Insufficient documentation

## 2017-02-07 DIAGNOSIS — Z1212 Encounter for screening for malignant neoplasm of rectum: Secondary | ICD-10-CM

## 2017-02-07 DIAGNOSIS — K648 Other hemorrhoids: Secondary | ICD-10-CM | POA: Insufficient documentation

## 2017-02-07 DIAGNOSIS — Z791 Long term (current) use of non-steroidal anti-inflammatories (NSAID): Secondary | ICD-10-CM | POA: Diagnosis not present

## 2017-02-07 DIAGNOSIS — K6389 Other specified diseases of intestine: Secondary | ICD-10-CM | POA: Diagnosis not present

## 2017-02-07 DIAGNOSIS — Z880 Allergy status to penicillin: Secondary | ICD-10-CM | POA: Insufficient documentation

## 2017-02-07 DIAGNOSIS — F17210 Nicotine dependence, cigarettes, uncomplicated: Secondary | ICD-10-CM | POA: Diagnosis not present

## 2017-02-07 DIAGNOSIS — Z1211 Encounter for screening for malignant neoplasm of colon: Secondary | ICD-10-CM | POA: Diagnosis present

## 2017-02-07 DIAGNOSIS — M1990 Unspecified osteoarthritis, unspecified site: Secondary | ICD-10-CM | POA: Insufficient documentation

## 2017-02-07 DIAGNOSIS — F419 Anxiety disorder, unspecified: Secondary | ICD-10-CM | POA: Insufficient documentation

## 2017-02-07 DIAGNOSIS — K644 Residual hemorrhoidal skin tags: Secondary | ICD-10-CM | POA: Insufficient documentation

## 2017-02-07 DIAGNOSIS — E039 Hypothyroidism, unspecified: Secondary | ICD-10-CM | POA: Insufficient documentation

## 2017-02-07 DIAGNOSIS — K219 Gastro-esophageal reflux disease without esophagitis: Secondary | ICD-10-CM | POA: Diagnosis not present

## 2017-02-07 DIAGNOSIS — Z79899 Other long term (current) drug therapy: Secondary | ICD-10-CM | POA: Insufficient documentation

## 2017-02-07 HISTORY — PX: COLONOSCOPY WITH PROPOFOL: SHX5780

## 2017-02-07 SURGERY — COLONOSCOPY WITH PROPOFOL
Anesthesia: Monitor Anesthesia Care

## 2017-02-07 MED ORDER — PROPOFOL 10 MG/ML IV BOLUS
INTRAVENOUS | Status: DC | PRN
  Administered 2017-02-07: 20 mg via INTRAVENOUS

## 2017-02-07 MED ORDER — LACTATED RINGERS IV SOLN
INTRAVENOUS | Status: DC
  Administered 2017-02-07: 13:00:00 via INTRAVENOUS

## 2017-02-07 MED ORDER — CHLORHEXIDINE GLUCONATE CLOTH 2 % EX PADS: 6.0000 | MEDICATED_PAD | Freq: Once | CUTANEOUS | Status: DC

## 2017-02-07 MED ORDER — MIDAZOLAM HCL 2 MG/2ML IJ SOLN
1.0000 mg | INTRAMUSCULAR | Status: AC
  Administered 2017-02-07 (×2): 1 mg via INTRAVENOUS

## 2017-02-07 MED ORDER — PROPOFOL 10 MG/ML IV BOLUS
INTRAVENOUS | Status: AC
  Filled 2017-02-07: qty 40

## 2017-02-07 MED ORDER — FENTANYL CITRATE (PF) 100 MCG/2ML IJ SOLN
INTRAMUSCULAR | Status: AC
  Filled 2017-02-07: qty 2

## 2017-02-07 MED ORDER — PROPOFOL 500 MG/50ML IV EMUL
INTRAVENOUS | Status: DC | PRN
  Administered 2017-02-07: 150 ug/kg/min via INTRAVENOUS
  Administered 2017-02-07: 14:00:00 via INTRAVENOUS

## 2017-02-07 MED ORDER — MIDAZOLAM HCL 2 MG/2ML IJ SOLN
INTRAMUSCULAR | Status: AC
  Filled 2017-02-07: qty 2

## 2017-02-07 MED ORDER — LIDOCAINE HCL 1 % IJ SOLN: INTRAMUSCULAR | Status: DC | PRN

## 2017-02-07 MED ORDER — FENTANYL CITRATE (PF) 100 MCG/2ML IJ SOLN
25.0000 ug | Freq: Once | INTRAMUSCULAR | Status: AC
  Administered 2017-02-07: 25 ug via INTRAVENOUS

## 2017-02-07 NOTE — Discharge Instructions (Signed)
PATIENT INSTRUCTIONS POST-ANESTHESIA  IMMEDIATELY FOLLOWING SURGERY:  Do not drive or operate machinery for the first twenty four hours after surgery.  Do not make any important decisions for twenty four hours after surgery or while taking narcotic pain medications or sedatives.  If you develop intractable nausea and vomiting or a severe headache please notify your doctor immediately.  FOLLOW-UP:  Please make an appointment with your surgeon as instructed. You do not need to follow up with anesthesia unless specifically instructed to do so.  WOUND CARE INSTRUCTIONS (if applicable):  Keep a dry clean dressing on the anesthesia/puncture wound site if there is drainage.  Once the wound has quit draining you may leave it open to air.  Generally you should leave the bandage intact for twenty four hours unless there is drainage.  If the epidural site drains for more than 36-48 hours please call the anesthesia department.  QUESTIONS?:  Please feel free to call your physician or the hospital operator if you have any questions, and they will be happy to assist you.        You have moderate size internal hemorrhoids. YOU DID NOT HAVE ANY POLYPS.   DRINK WATER TO KEEP YOUR URINE LIGHT YELLOW.  FOLLOW A HIGH FIBER DIET. AVOID ITEMS THAT CAUSE BLOATING. SEE INFO BELOW.  USE PREPARATION H FOUR TIMES  A DAY IF NEEDED TO RELIEVE RECTAL PAIN/PRESSURE/BLEEDING.  Next colonoscopy in 10 years.  Colonoscopy Care After Read the instructions outlined below and refer to this sheet in the next week. These discharge instructions provide you with general information on caring for yourself after you leave the hospital. While your treatment has been planned according to the most current medical practices available, unavoidable complications occasionally occur. If you have any problems or questions after discharge, call DR. FIELDS, [phone].  ACTIVITY  You may resume your regular activity, but move at a slower  pace for the next 24 hours.   Take frequent rest periods for the next 24 hours.   Walking will help get rid of the air and reduce the bloated feeling in your belly (abdomen).   No driving for 24 hours (because of the medicine (anesthesia) used during the test).   You may shower.   Do not sign any important legal documents or operate any machinery for 24 hours (because of the anesthesia used during the test).    NUTRITION  Drink plenty of fluids.   You may resume your normal diet as instructed by your doctor.   Begin with a light meal and progress to your normal diet. Heavy or fried foods are harder to digest and may make you feel sick to your stomach (nauseated).   Avoid alcoholic beverages for 24 hours or as instructed.    MEDICATIONS  You may resume your normal medications.   WHAT YOU CAN EXPECT TODAY  Some feelings of bloating in the abdomen.   Passage of more gas than usual.   Spotting of blood in your stool or on the toilet paper  .  IF YOU HAD POLYPS REMOVED DURING THE COLONOSCOPY:  Eat a soft diet IF YOU HAVE NAUSEA, BLOATING, ABDOMINAL PAIN, OR VOMITING.    FINDING OUT THE RESULTS OF YOUR TEST Not all test results are available during your visit. DR. Darrick Penna WILL CALL YOU WITHIN 14 DAYS OF YOUR PROCEDUE WITH YOUR RESULTS. Do not assume everything is normal if you have not heard from DR. FIELDS, CALL HER OFFICE AT [phone].  SEEK IMMEDIATE MEDICAL ATTENTION  AND CALL THE OFFICE: [phone] IF:  You have more than a spotting of blood in your stool.   Your belly is swollen (abdominal distention).   You are nauseated or vomiting.   You have a temperature over 101F.   You have abdominal pain or discomfort that is severe or gets worse throughout the day.  High-Fiber Diet A high-fiber diet changes your normal diet to include more whole grains, legumes, fruits, and vegetables. Changes in the diet involve replacing refined carbohydrates with unrefined  foods. The calorie level of the diet is essentially unchanged. The Dietary Reference Intake (recommended amount) for adult males is 38 grams per day. For adult females, it is 25 grams per day. Pregnant and lactating women should consume 28 grams of fiber per day. Fiber is the intact part of a plant that is not broken down during digestion. Functional fiber is fiber that has been isolated from the plant to provide a beneficial effect in the body. PURPOSE  Increase stool bulk.   Ease and regulate bowel movements.   Lower cholesterol.   REDUCE RISK OF COLON CANCER  INDICATIONS THAT YOU NEED MORE FIBER  Constipation and hemorrhoids.   Uncomplicated diverticulosis (intestine condition) and irritable bowel syndrome.   Weight management.   As a protective measure against hardening of the arteries (atherosclerosis), diabetes, and cancer.   GUIDELINES FOR INCREASING FIBER IN THE DIET  Start adding fiber to the diet slowly. A gradual increase of about 5 more grams (2 slices of whole-wheat bread, 2 servings of most fruits or vegetables, or 1 bowl of high-fiber cereal) per day is best. Too rapid an increase in fiber may result in constipation, flatulence, and bloating.   Drink enough water and fluids to keep your urine clear or pale yellow. Water, juice, or caffeine-free drinks are recommended. Not drinking enough fluid may cause constipation.   Eat a variety of high-fiber foods rather than one type of fiber.   Try to increase your intake of fiber through using high-fiber foods rather than fiber pills or supplements that contain small amounts of fiber.   The goal is to change the types of food eaten. Do not supplement your present diet with high-fiber foods, but replace foods in your present diet.    INCLUDE A VARIETY OF FIBER SOURCES  Replace refined and processed grains with whole grains, canned fruits with fresh fruits, and incorporate other fiber sources. White rice, white breads, and  most bakery goods contain little or no fiber.   Brown whole-grain rice, buckwheat oats, and many fruits and vegetables are all good sources of fiber. These include: broccoli, Brussels sprouts, cabbage, cauliflower, beets, sweet potatoes, white potatoes (skin on), carrots, tomatoes, eggplant, squash, berries, fresh fruits, and dried fruits.   Cereals appear to be the richest source of fiber. Cereal fiber is found in whole grains and bran. Bran is the fiber-rich outer coat of cereal grain, which is largely removed in refining. In whole-grain cereals, the bran remains. In breakfast cereals, the largest amount of fiber is found in those with "bran" in their names. The fiber content is sometimes indicated on the label.   You may need to include additional fruits and vegetables each day.   In baking, for 1 cup white flour, you may use the following substitutions:   1 cup whole-wheat flour minus 2 tablespoons.   1/2 cup white flour plus 1/2 cup whole-wheat flour.   Hemorrhoids Hemorrhoids are dilated (enlarged) veins around the rectum. Sometimes clots will form  in the veins. This makes them swollen and painful. These are called thrombosed hemorrhoids. Causes of hemorrhoids include:  Constipation.   Straining to have a bowel movement.   HEAVY LIFTING  HOME CARE INSTRUCTIONS  Eat a well balanced diet and drink 6 to 8 glasses of water every day to avoid constipation. You may also use a bulk laxative.   Avoid straining to have bowel movements.   Keep anal area dry and clean.   Do not use a donut shaped pillow or sit on the toilet for long periods. This increases blood pooling and pain.   Move your bowels when your body has the urge; this will require less straining and will decrease pain and pressure.

## 2017-02-07 NOTE — Anesthesia Preprocedure Evaluation (Signed)
Anesthesia Evaluation  Patient identified by MRN, date of birth, ID band Patient awake    Reviewed: Allergy & Precautions, NPO status , Patient's Chart, lab work & pertinent test results  Airway Mallampati: II  TM Distance: >3 FB     Dental  (+) Teeth Intact   Pulmonary Current Smoker,    breath sounds clear to auscultation       Cardiovascular hypertension, Pt. on medications  Rhythm:Regular Rate:Normal     Neuro/Psych PSYCHIATRIC DISORDERS Anxiety    GI/Hepatic GERD  Medicated and Controlled,  Endo/Other  Hypothyroidism   Renal/GU      Musculoskeletal  (+) Arthritis ,   Abdominal   Peds  Hematology   Anesthesia Other Findings   Reproductive/Obstetrics                             Anesthesia Physical Anesthesia Plan  ASA: II  Anesthesia Plan: MAC   Post-op Pain Management:    Induction: Intravenous  PONV Risk Score and Plan:   Airway Management Planned: Simple Face Mask  Additional Equipment:   Intra-op Plan:   Post-operative Plan:   Informed Consent: I have reviewed the patients History and Physical, chart, labs and discussed the procedure including the risks, benefits and alternatives for the proposed anesthesia with the patient or authorized representative who has indicated his/her understanding and acceptance.     Plan Discussed with:   Anesthesia Plan Comments:         Anesthesia Quick Evaluation

## 2017-02-07 NOTE — Interval H&P Note (Signed)
History and Physical Interval Note:  02/07/2017 1:49 PM  Melissa Vega  has presented today for surgery, with the diagnosis of screening colonoscopy  The various methods of treatment have been discussed with the patient and family. After consideration of risks, benefits and other options for treatment, the patient has consented to  Procedure(s) with comments: COLONOSCOPY WITH PROPOFOL (N/A) - 2:15pm as a surgical intervention .  The patient's history has been reviewed, patient examined, no change in status, stable for surgery.  I have reviewed the patient's chart and labs.  Questions were answered to the patient's satisfaction.     [employer] Fields

## 2017-02-07 NOTE — Transfer of Care (Signed)
Immediate Anesthesia Transfer of Care Note  Patient: Melissa Vega  Procedure(s) Performed: COLONOSCOPY WITH PROPOFOL (N/A )  Patient Location: PACU  Anesthesia Type:MAC  Level of Consciousness: awake, alert , oriented and patient cooperative  Airway & Oxygen Therapy: Patient Spontanous Breathing  Post-op Assessment: Report given to RN and Post -op Vital signs reviewed and stable  Post vital signs: Reviewed and stable  Last Vitals:  Vitals:   02/07/17 1355 02/07/17 1400  BP: 109/72 123/75  Pulse:    Resp: 16 12  Temp:    SpO2: 98% 98%    Last Pain:  Vitals:   02/07/17 1239  TempSrc: Oral      Patients Stated Pain Goal: 6 (82/95/62 1308)  Complications: No apparent anesthesia complications

## 2017-02-07 NOTE — Op Note (Signed)
[facility] Patient Name: Melissa Vega Procedure Date: 02/07/2017 1:44 PM MRN: [id number] Date of Birth: March 24, 1960 Attending MD: Barney Drain MD, MD CSN: [id number] Age: 57 Admit Type: Outpatient Procedure:                Colonoscopy, SCREENING Indications:              Screening for colorectal malignant neoplasm Providers:                Barney Drain MD, MD, Janeece Riggers, RN, Randa Spike, Technician Referring MD:             Edwinna Areola. Hall MD Medicines:                Propofol per Anesthesia Complications:            No immediate complications. Estimated Blood Loss:     Estimated blood loss: none. Procedure:                Pre-Anesthesia Assessment:                           - Prior to the procedure, a History and Physical                            was performed, and patient medications and                            allergies were reviewed. The patient's tolerance of                            previous anesthesia was also reviewed. The risks                            and benefits of the procedure and the sedation                            options and risks were discussed with the patient.                            All questions were answered, and informed consent                            was obtained. Prior Anticoagulants: The patient has                            taken aspirin, last dose was 1 day prior to                            procedure. ASA Grade Assessment: II - A patient                            with mild systemic disease. After reviewing the  risks and benefits, the patient was deemed in                            satisfactory condition to undergo the procedure.                            After obtaining informed consent, the colonoscope                            was passed under direct vision. Throughout the                            procedure, the patient's blood pressure, pulse, and          oxygen saturations were monitored continuously. The                            EC-[id number] ([id number]) scope was introduced through                            the anus and advanced to the the cecum, identified                            by appendiceal orifice and ileocecal valve. The                            colonoscopy was somewhat difficult due to                            significant looping. Successful completion of the                            procedure was aided by straightening and shortening                            the scope to obtain bowel loop reduction and                            COLOWRAP. The patient tolerated the procedure well.                            The quality of the bowel preparation was excellent.                            The ileocecal valve, appendiceal orifice, and                            rectum were photographed. Scope In: 2:17:06 PM Scope Out: 2:29:48 PM Scope Withdrawal Time: 0 hours 9 minutes 33 seconds  Total Procedure Duration: 0 hours 12 minutes 42 seconds  Findings:      The recto-sigmoid colon, sigmoid colon and descending colon revealed       moderately excessive looping.      The exam was otherwise without abnormality.      External and internal hemorrhoids were  found during retroflexion. The       hemorrhoids were moderate. Impression:               - There was significant looping of the colon.                           - The examination was otherwise normal.                           - MODERATE Internal AND EXTERNAL hemorrhoids. Moderate Sedation:      Per Anesthesia Care Recommendation:           - Repeat colonoscopy in 10 years for surveillance                            W/ MAC/COLOWRAP.                           - High fiber diet.                           - Continue present medications.                           - Patient has a contact number available for                            emergencies. The signs and symptoms of  potential                            delayed complications were discussed with the                            patient. Return to normal activities tomorrow.                            Written discharge instructions were provided to the                            patient. Procedure Code(s):        --- Professional ---                           [phone], Colonoscopy, flexible; diagnostic, including                            collection of specimen(s) by brushing or washing,                            when performed (separate procedure) Diagnosis Code(s):        --- Professional ---                           Z12.11, Encounter for screening for malignant                            neoplasm of colon  K64.8, Other hemorrhoids CPT copyright 2016 [employer]. All rights reserved. The codes documented in this report are preliminary and upon coder review may  be revised to meet current compliance requirements. Barney Drain, MD Barney Drain MD, MD 02/07/2017 2:39:07 PM This report has been signed electronically. Number of Addenda: 0

## 2017-02-08 NOTE — Anesthesia Postprocedure Evaluation (Signed)
Anesthesia Post Note Late Entry for 02/07/17 1505 Patient: Melissa Vega  Procedure(s) Performed: COLONOSCOPY WITH PROPOFOL (N/A )  Patient location during evaluation: PACU Anesthesia Type: MAC Level of consciousness: awake and alert, oriented and patient cooperative Pain management: pain level controlled Vital Signs Assessment: post-procedure vital signs reviewed and stable Respiratory status: spontaneous breathing Cardiovascular status: stable Postop Assessment: no apparent nausea or vomiting Anesthetic complications: no     Last Vitals:  Vitals:   02/07/17 1435 02/07/17 1459  BP: 100/72 125/81  Pulse: 80 78  Resp: 16 15  Temp: 36.6 C 36.6 C  SpO2: 99% 96%    Last Pain:  Vitals:   02/07/17 1459  TempSrc: Oral                 Tyreek Clabo A

## 2017-02-09 ENCOUNTER — Encounter (HOSPITAL_COMMUNITY): Payer: Self-pay | Admitting: Gastroenterology

## 2017-07-03 ENCOUNTER — Other Ambulatory Visit (HOSPITAL_COMMUNITY): Payer: Self-pay | Admitting: Internal Medicine

## 2017-07-03 DIAGNOSIS — Z1231 Encounter for screening mammogram for malignant neoplasm of breast: Secondary | ICD-10-CM

## 2017-07-19 ENCOUNTER — Ambulatory Visit (HOSPITAL_COMMUNITY)
Admission: RE | Admit: 2017-07-19 | Discharge: 2017-07-19 | Disposition: A | Discharge status: Home / Self Care | Payer: BLUE CROSS/BLUE SHIELD | Source: Ambulatory Visit | Attending: Internal Medicine | Admitting: Internal Medicine

## 2017-07-19 DIAGNOSIS — Z1231 Encounter for screening mammogram for malignant neoplasm of breast: Secondary | ICD-10-CM | POA: Diagnosis not present

## 2020-12-08 ENCOUNTER — Other Ambulatory Visit (HOSPITAL_COMMUNITY): Payer: Self-pay | Admitting: Internal Medicine

## 2020-12-08 DIAGNOSIS — Z1231 Encounter for screening mammogram for malignant neoplasm of breast: Secondary | ICD-10-CM

## 2020-12-21 ENCOUNTER — Other Ambulatory Visit: Payer: Self-pay

## 2020-12-21 ENCOUNTER — Ambulatory Visit (HOSPITAL_COMMUNITY)
Admission: RE | Admit: 2020-12-21 | Discharge: 2020-12-21 | Disposition: A | Discharge status: Home / Self Care | Payer: Medicare Other | Source: Ambulatory Visit | Attending: Internal Medicine | Admitting: Internal Medicine

## 2020-12-21 DIAGNOSIS — Z1231 Encounter for screening mammogram for malignant neoplasm of breast: Secondary | ICD-10-CM | POA: Insufficient documentation

## 2021-03-02 DIAGNOSIS — E782 Mixed hyperlipidemia: Secondary | ICD-10-CM | POA: Diagnosis not present

## 2021-03-02 DIAGNOSIS — E039 Hypothyroidism, unspecified: Secondary | ICD-10-CM | POA: Diagnosis not present

## 2021-03-09 DIAGNOSIS — E039 Hypothyroidism, unspecified: Secondary | ICD-10-CM | POA: Diagnosis not present

## 2021-03-09 DIAGNOSIS — R140 Abdominal distension (gaseous): Secondary | ICD-10-CM | POA: Diagnosis not present

## 2021-03-09 DIAGNOSIS — J309 Allergic rhinitis, unspecified: Secondary | ICD-10-CM | POA: Diagnosis not present

## 2021-03-09 DIAGNOSIS — K219 Gastro-esophageal reflux disease without esophagitis: Secondary | ICD-10-CM | POA: Diagnosis not present

## 2021-03-09 DIAGNOSIS — E871 Hypo-osmolality and hyponatremia: Secondary | ICD-10-CM | POA: Diagnosis not present

## 2021-03-09 DIAGNOSIS — F17200 Nicotine dependence, unspecified, uncomplicated: Secondary | ICD-10-CM | POA: Diagnosis not present

## 2021-03-09 DIAGNOSIS — E782 Mixed hyperlipidemia: Secondary | ICD-10-CM | POA: Diagnosis not present

## 2021-03-09 DIAGNOSIS — K7689 Other specified diseases of liver: Secondary | ICD-10-CM | POA: Diagnosis not present

## 2021-03-09 DIAGNOSIS — I10 Essential (primary) hypertension: Secondary | ICD-10-CM | POA: Diagnosis not present

## 2021-03-09 DIAGNOSIS — G629 Polyneuropathy, unspecified: Secondary | ICD-10-CM | POA: Diagnosis not present

## 2021-03-09 DIAGNOSIS — J302 Other seasonal allergic rhinitis: Secondary | ICD-10-CM | POA: Diagnosis not present

## 2021-08-27 DIAGNOSIS — E782 Mixed hyperlipidemia: Secondary | ICD-10-CM | POA: Diagnosis not present

## 2021-08-27 DIAGNOSIS — E039 Hypothyroidism, unspecified: Secondary | ICD-10-CM | POA: Diagnosis not present

## 2021-09-09 DIAGNOSIS — E782 Mixed hyperlipidemia: Secondary | ICD-10-CM | POA: Diagnosis not present

## 2021-09-09 DIAGNOSIS — F17200 Nicotine dependence, unspecified, uncomplicated: Secondary | ICD-10-CM | POA: Diagnosis not present

## 2021-09-09 DIAGNOSIS — K219 Gastro-esophageal reflux disease without esophagitis: Secondary | ICD-10-CM | POA: Diagnosis not present

## 2021-09-09 DIAGNOSIS — R7301 Impaired fasting glucose: Secondary | ICD-10-CM | POA: Diagnosis not present

## 2021-09-09 DIAGNOSIS — I10 Essential (primary) hypertension: Secondary | ICD-10-CM | POA: Diagnosis not present

## 2021-09-09 DIAGNOSIS — E039 Hypothyroidism, unspecified: Secondary | ICD-10-CM | POA: Diagnosis not present

## 2021-09-09 DIAGNOSIS — K7689 Other specified diseases of liver: Secondary | ICD-10-CM | POA: Diagnosis not present

## 2021-09-09 DIAGNOSIS — G629 Polyneuropathy, unspecified: Secondary | ICD-10-CM | POA: Diagnosis not present

## 2021-09-09 DIAGNOSIS — J309 Allergic rhinitis, unspecified: Secondary | ICD-10-CM | POA: Diagnosis not present

## 2021-09-09 DIAGNOSIS — R140 Abdominal distension (gaseous): Secondary | ICD-10-CM | POA: Diagnosis not present

## 2021-09-09 DIAGNOSIS — E871 Hypo-osmolality and hyponatremia: Secondary | ICD-10-CM | POA: Diagnosis not present

## 2021-11-10 DIAGNOSIS — H353131 Nonexudative age-related macular degeneration, bilateral, early dry stage: Secondary | ICD-10-CM | POA: Diagnosis not present

## 2021-11-18 ENCOUNTER — Other Ambulatory Visit (HOSPITAL_COMMUNITY): Payer: Self-pay | Admitting: Internal Medicine

## 2021-11-18 DIAGNOSIS — Z1231 Encounter for screening mammogram for malignant neoplasm of breast: Secondary | ICD-10-CM

## 2021-12-24 ENCOUNTER — Ambulatory Visit (HOSPITAL_COMMUNITY)
Admission: RE | Admit: 2021-12-24 | Discharge: 2021-12-24 | Disposition: A | Discharge status: Home / Self Care | Payer: Medicare Other | Source: Ambulatory Visit | Attending: Internal Medicine | Admitting: Internal Medicine

## 2021-12-24 DIAGNOSIS — Z1231 Encounter for screening mammogram for malignant neoplasm of breast: Secondary | ICD-10-CM | POA: Insufficient documentation

## 2022-03-04 DIAGNOSIS — E782 Mixed hyperlipidemia: Secondary | ICD-10-CM | POA: Diagnosis not present

## 2022-03-04 DIAGNOSIS — E039 Hypothyroidism, unspecified: Secondary | ICD-10-CM | POA: Diagnosis not present

## 2022-03-10 ENCOUNTER — Other Ambulatory Visit (HOSPITAL_COMMUNITY): Payer: Self-pay | Admitting: Internal Medicine

## 2022-03-10 DIAGNOSIS — I10 Essential (primary) hypertension: Secondary | ICD-10-CM | POA: Diagnosis not present

## 2022-03-10 DIAGNOSIS — K7689 Other specified diseases of liver: Secondary | ICD-10-CM | POA: Diagnosis not present

## 2022-03-10 DIAGNOSIS — R7401 Elevation of levels of liver transaminase levels: Secondary | ICD-10-CM | POA: Diagnosis not present

## 2022-03-10 DIAGNOSIS — G629 Polyneuropathy, unspecified: Secondary | ICD-10-CM | POA: Diagnosis not present

## 2022-03-10 DIAGNOSIS — Z13820 Encounter for screening for osteoporosis: Secondary | ICD-10-CM

## 2022-03-10 DIAGNOSIS — E782 Mixed hyperlipidemia: Secondary | ICD-10-CM | POA: Diagnosis not present

## 2022-03-10 DIAGNOSIS — E870 Hyperosmolality and hypernatremia: Secondary | ICD-10-CM | POA: Diagnosis not present

## 2022-03-10 DIAGNOSIS — E039 Hypothyroidism, unspecified: Secondary | ICD-10-CM | POA: Diagnosis not present

## 2022-03-10 DIAGNOSIS — K219 Gastro-esophageal reflux disease without esophagitis: Secondary | ICD-10-CM | POA: Diagnosis not present

## 2022-03-10 DIAGNOSIS — J309 Allergic rhinitis, unspecified: Secondary | ICD-10-CM | POA: Diagnosis not present

## 2022-03-10 DIAGNOSIS — Z0001 Encounter for general adult medical examination with abnormal findings: Secondary | ICD-10-CM | POA: Diagnosis not present

## 2022-03-10 DIAGNOSIS — R7301 Impaired fasting glucose: Secondary | ICD-10-CM | POA: Diagnosis not present

## 2022-04-11 DIAGNOSIS — E039 Hypothyroidism, unspecified: Secondary | ICD-10-CM | POA: Diagnosis not present

## 2022-04-11 DIAGNOSIS — I10 Essential (primary) hypertension: Secondary | ICD-10-CM | POA: Diagnosis not present

## 2022-04-11 DIAGNOSIS — F17210 Nicotine dependence, cigarettes, uncomplicated: Secondary | ICD-10-CM | POA: Diagnosis not present

## 2022-04-11 DIAGNOSIS — Z79899 Other long term (current) drug therapy: Secondary | ICD-10-CM | POA: Diagnosis not present

## 2022-04-11 DIAGNOSIS — G629 Polyneuropathy, unspecified: Secondary | ICD-10-CM | POA: Diagnosis not present

## 2022-04-11 DIAGNOSIS — E782 Mixed hyperlipidemia: Secondary | ICD-10-CM | POA: Diagnosis not present

## 2022-04-11 DIAGNOSIS — Z713 Dietary counseling and surveillance: Secondary | ICD-10-CM | POA: Diagnosis not present

## 2022-04-11 DIAGNOSIS — K219 Gastro-esophageal reflux disease without esophagitis: Secondary | ICD-10-CM | POA: Diagnosis not present

## 2022-04-25 ENCOUNTER — Other Ambulatory Visit: Payer: Self-pay | Admitting: Radiation Oncology

## 2022-05-05 ENCOUNTER — Ambulatory Visit (HOSPITAL_COMMUNITY)
Admission: RE | Admit: 2022-05-05 | Discharge: 2022-05-05 | Disposition: A | Discharge status: Home / Self Care | Payer: Medicare Other | Source: Ambulatory Visit | Attending: Internal Medicine | Admitting: Internal Medicine

## 2022-05-05 ENCOUNTER — Ambulatory Visit (HOSPITAL_COMMUNITY)
Admission: RE | Admit: 2022-05-05 | Discharge: 2022-05-05 | Disposition: A | Discharge status: Home / Self Care | Payer: Self-pay | Source: Ambulatory Visit | Attending: Internal Medicine | Admitting: Internal Medicine

## 2022-05-05 DIAGNOSIS — Z87891 Personal history of nicotine dependence: Secondary | ICD-10-CM | POA: Insufficient documentation

## 2022-05-05 DIAGNOSIS — Z13820 Encounter for screening for osteoporosis: Secondary | ICD-10-CM | POA: Insufficient documentation

## 2022-05-05 DIAGNOSIS — E782 Mixed hyperlipidemia: Secondary | ICD-10-CM | POA: Insufficient documentation

## 2022-05-05 DIAGNOSIS — Z780 Asymptomatic menopausal state: Secondary | ICD-10-CM | POA: Diagnosis not present

## 2022-05-05 DIAGNOSIS — M8589 Other specified disorders of bone density and structure, multiple sites: Secondary | ICD-10-CM | POA: Insufficient documentation

## 2022-08-31 DIAGNOSIS — H16223 Keratoconjunctivitis sicca, not specified as Sjogren's, bilateral: Secondary | ICD-10-CM | POA: Diagnosis not present

## 2022-08-31 DIAGNOSIS — H353131 Nonexudative age-related macular degeneration, bilateral, early dry stage: Secondary | ICD-10-CM | POA: Diagnosis not present

## 2022-08-31 DIAGNOSIS — H25813 Combined forms of age-related cataract, bilateral: Secondary | ICD-10-CM | POA: Diagnosis not present

## 2022-09-08 DIAGNOSIS — E039 Hypothyroidism, unspecified: Secondary | ICD-10-CM | POA: Diagnosis not present

## 2022-09-08 DIAGNOSIS — E782 Mixed hyperlipidemia: Secondary | ICD-10-CM | POA: Diagnosis not present

## 2022-09-08 DIAGNOSIS — R7301 Impaired fasting glucose: Secondary | ICD-10-CM | POA: Diagnosis not present

## 2022-09-19 DIAGNOSIS — R7301 Impaired fasting glucose: Secondary | ICD-10-CM | POA: Diagnosis not present

## 2022-09-19 DIAGNOSIS — I10 Essential (primary) hypertension: Secondary | ICD-10-CM | POA: Diagnosis not present

## 2022-09-19 DIAGNOSIS — E782 Mixed hyperlipidemia: Secondary | ICD-10-CM | POA: Diagnosis not present

## 2022-09-19 DIAGNOSIS — E871 Hypo-osmolality and hyponatremia: Secondary | ICD-10-CM | POA: Diagnosis not present

## 2022-09-19 DIAGNOSIS — F17200 Nicotine dependence, unspecified, uncomplicated: Secondary | ICD-10-CM | POA: Diagnosis not present

## 2022-09-19 DIAGNOSIS — K219 Gastro-esophageal reflux disease without esophagitis: Secondary | ICD-10-CM | POA: Diagnosis not present

## 2022-09-19 DIAGNOSIS — K7689 Other specified diseases of liver: Secondary | ICD-10-CM | POA: Diagnosis not present

## 2022-09-19 DIAGNOSIS — J309 Allergic rhinitis, unspecified: Secondary | ICD-10-CM | POA: Diagnosis not present

## 2022-09-19 DIAGNOSIS — G629 Polyneuropathy, unspecified: Secondary | ICD-10-CM | POA: Diagnosis not present

## 2022-09-19 DIAGNOSIS — E039 Hypothyroidism, unspecified: Secondary | ICD-10-CM | POA: Diagnosis not present

## 2022-11-18 ENCOUNTER — Other Ambulatory Visit (HOSPITAL_COMMUNITY): Payer: Self-pay | Admitting: Internal Medicine

## 2022-11-18 DIAGNOSIS — Z1231 Encounter for screening mammogram for malignant neoplasm of breast: Secondary | ICD-10-CM

## 2022-12-22 DIAGNOSIS — F17210 Nicotine dependence, cigarettes, uncomplicated: Secondary | ICD-10-CM | POA: Diagnosis not present

## 2022-12-22 DIAGNOSIS — Z713 Dietary counseling and surveillance: Secondary | ICD-10-CM | POA: Diagnosis not present

## 2022-12-22 DIAGNOSIS — Z7182 Exercise counseling: Secondary | ICD-10-CM | POA: Diagnosis not present

## 2022-12-22 DIAGNOSIS — M179 Osteoarthritis of knee, unspecified: Secondary | ICD-10-CM | POA: Diagnosis not present

## 2022-12-22 DIAGNOSIS — Z79899 Other long term (current) drug therapy: Secondary | ICD-10-CM | POA: Diagnosis not present

## 2022-12-22 DIAGNOSIS — I10 Essential (primary) hypertension: Secondary | ICD-10-CM | POA: Diagnosis not present

## 2022-12-22 DIAGNOSIS — G629 Polyneuropathy, unspecified: Secondary | ICD-10-CM | POA: Diagnosis not present

## 2023-01-02 ENCOUNTER — Ambulatory Visit (HOSPITAL_COMMUNITY)
Admission: RE | Admit: 2023-01-02 | Discharge: 2023-01-02 | Disposition: A | Discharge status: Home / Self Care | Payer: Medicare Other | Source: Ambulatory Visit | Attending: Internal Medicine | Admitting: Internal Medicine

## 2023-01-02 DIAGNOSIS — Z1231 Encounter for screening mammogram for malignant neoplasm of breast: Secondary | ICD-10-CM | POA: Insufficient documentation

## 2023-03-13 DIAGNOSIS — E039 Hypothyroidism, unspecified: Secondary | ICD-10-CM | POA: Diagnosis not present

## 2023-03-13 DIAGNOSIS — E782 Mixed hyperlipidemia: Secondary | ICD-10-CM | POA: Diagnosis not present

## 2023-03-13 DIAGNOSIS — M8580 Other specified disorders of bone density and structure, unspecified site: Secondary | ICD-10-CM | POA: Diagnosis not present

## 2023-03-13 DIAGNOSIS — R7301 Impaired fasting glucose: Secondary | ICD-10-CM | POA: Diagnosis not present

## 2023-03-28 DIAGNOSIS — M8580 Other specified disorders of bone density and structure, unspecified site: Secondary | ICD-10-CM | POA: Diagnosis not present

## 2023-03-28 DIAGNOSIS — G629 Polyneuropathy, unspecified: Secondary | ICD-10-CM | POA: Diagnosis not present

## 2023-03-28 DIAGNOSIS — J309 Allergic rhinitis, unspecified: Secondary | ICD-10-CM | POA: Diagnosis not present

## 2023-03-28 DIAGNOSIS — E039 Hypothyroidism, unspecified: Secondary | ICD-10-CM | POA: Diagnosis not present

## 2023-03-28 DIAGNOSIS — M179 Osteoarthritis of knee, unspecified: Secondary | ICD-10-CM | POA: Diagnosis not present

## 2023-03-28 DIAGNOSIS — E782 Mixed hyperlipidemia: Secondary | ICD-10-CM | POA: Diagnosis not present

## 2023-03-28 DIAGNOSIS — I10 Essential (primary) hypertension: Secondary | ICD-10-CM | POA: Diagnosis not present

## 2023-03-28 DIAGNOSIS — R7301 Impaired fasting glucose: Secondary | ICD-10-CM | POA: Diagnosis not present

## 2023-03-28 DIAGNOSIS — R7401 Elevation of levels of liver transaminase levels: Secondary | ICD-10-CM | POA: Diagnosis not present

## 2023-03-28 DIAGNOSIS — E871 Hypo-osmolality and hyponatremia: Secondary | ICD-10-CM | POA: Diagnosis not present

## 2023-03-28 DIAGNOSIS — K219 Gastro-esophageal reflux disease without esophagitis: Secondary | ICD-10-CM | POA: Diagnosis not present

## 2023-07-04 DIAGNOSIS — K219 Gastro-esophageal reflux disease without esophagitis: Secondary | ICD-10-CM | POA: Diagnosis not present

## 2023-07-04 DIAGNOSIS — R232 Flushing: Secondary | ICD-10-CM | POA: Diagnosis not present

## 2023-07-04 DIAGNOSIS — I10 Essential (primary) hypertension: Secondary | ICD-10-CM | POA: Diagnosis not present

## 2023-07-04 DIAGNOSIS — F17200 Nicotine dependence, unspecified, uncomplicated: Secondary | ICD-10-CM | POA: Diagnosis not present

## 2023-07-04 DIAGNOSIS — M179 Osteoarthritis of knee, unspecified: Secondary | ICD-10-CM | POA: Diagnosis not present

## 2023-07-04 DIAGNOSIS — G629 Polyneuropathy, unspecified: Secondary | ICD-10-CM | POA: Diagnosis not present

## 2023-07-04 DIAGNOSIS — Z713 Dietary counseling and surveillance: Secondary | ICD-10-CM | POA: Diagnosis not present

## 2023-09-22 DIAGNOSIS — M8580 Other specified disorders of bone density and structure, unspecified site: Secondary | ICD-10-CM | POA: Diagnosis not present

## 2023-09-22 DIAGNOSIS — E782 Mixed hyperlipidemia: Secondary | ICD-10-CM | POA: Diagnosis not present

## 2023-09-22 DIAGNOSIS — R7301 Impaired fasting glucose: Secondary | ICD-10-CM | POA: Diagnosis not present

## 2023-09-22 DIAGNOSIS — E039 Hypothyroidism, unspecified: Secondary | ICD-10-CM | POA: Diagnosis not present

## 2023-09-29 DIAGNOSIS — K219 Gastro-esophageal reflux disease without esophagitis: Secondary | ICD-10-CM | POA: Diagnosis not present

## 2023-09-29 DIAGNOSIS — F17210 Nicotine dependence, cigarettes, uncomplicated: Secondary | ICD-10-CM | POA: Diagnosis not present

## 2023-09-29 DIAGNOSIS — M179 Osteoarthritis of knee, unspecified: Secondary | ICD-10-CM | POA: Diagnosis not present

## 2023-09-29 DIAGNOSIS — I10 Essential (primary) hypertension: Secondary | ICD-10-CM | POA: Diagnosis not present

## 2023-09-29 DIAGNOSIS — E039 Hypothyroidism, unspecified: Secondary | ICD-10-CM | POA: Diagnosis not present

## 2023-09-29 DIAGNOSIS — G629 Polyneuropathy, unspecified: Secondary | ICD-10-CM | POA: Diagnosis not present

## 2023-09-29 DIAGNOSIS — E782 Mixed hyperlipidemia: Secondary | ICD-10-CM | POA: Diagnosis not present

## 2023-09-29 DIAGNOSIS — R232 Flushing: Secondary | ICD-10-CM | POA: Diagnosis not present

## 2023-12-13 ENCOUNTER — Other Ambulatory Visit (HOSPITAL_COMMUNITY): Payer: Self-pay | Admitting: Internal Medicine

## 2023-12-13 DIAGNOSIS — Z1231 Encounter for screening mammogram for malignant neoplasm of breast: Secondary | ICD-10-CM

## 2024-01-15 ENCOUNTER — Ambulatory Visit (HOSPITAL_COMMUNITY)
Admission: RE | Admit: 2024-01-15 | Discharge: 2024-01-15 | Disposition: A | Discharge status: Home / Self Care | Source: Ambulatory Visit | Attending: Internal Medicine | Admitting: Internal Medicine

## 2024-01-15 DIAGNOSIS — Z1231 Encounter for screening mammogram for malignant neoplasm of breast: Secondary | ICD-10-CM | POA: Insufficient documentation
